# Patient Record
Sex: Female | Born: 1995 | Hispanic: Yes | Marital: Married | Smoking: Never smoker
Health system: Southern US, Community
[De-identification: ages and names within clinical notes are randomized; demographics above are authoritative.]

## PROBLEM LIST (undated history)

## (undated) DIAGNOSIS — Z789 Other specified health status: Secondary | ICD-10-CM

## (undated) HISTORY — PX: OVARIAN CYST SURGERY: SHX726

---

## 2015-09-24 NOTE — L&D Delivery Note (Signed)
Patient had been pushing for >3 hours with about a 1 hour break. On check patient was OT. Rotated patient to LOA and patient began to make progress with pushing.   Delivery Note At 10:54 AM a viable female was delivered via Vaginal, Spontaneous Delivery (Presentation: vertex;LOA  ).  APGAR: 9, 9; weight pending  .   Placenta status: intact delivered with gently traction  Cord: 3 vessle with the following complications: none .    Anesthesia:  epidrual Episiotomy: None Lacerations: 1st degree Suture Repair: 3.0 monocryl Est. Blood Loss (mL):  250  Mom to postpartum.  Baby to Couplet care / Skin to Skin.  Ernestina Pennaicholas Cayce Quezada 07/24/2016, 11:17 AM

## 2015-12-05 ENCOUNTER — Ambulatory Visit (INDEPENDENT_AMBULATORY_CARE_PROVIDER_SITE_OTHER): Payer: Self-pay | Admitting: *Deleted

## 2015-12-05 ENCOUNTER — Encounter: Payer: Self-pay | Admitting: Family Medicine

## 2015-12-05 DIAGNOSIS — N926 Irregular menstruation, unspecified: Secondary | ICD-10-CM

## 2015-12-05 DIAGNOSIS — Z3201 Encounter for pregnancy test, result positive: Secondary | ICD-10-CM

## 2015-12-05 LAB — POCT PREGNANCY, URINE: Preg Test, Ur: POSITIVE — AB

## 2015-12-05 NOTE — Progress Notes (Signed)
[employer] interpreter # [phone] used for encounter.  LMP 11/20/15. Pregnancy verification letter given.

## 2016-02-15 LAB — OB RESULTS CONSOLE HIV ANTIBODY (ROUTINE TESTING): HIV: NONREACTIVE

## 2016-02-15 LAB — OB RESULTS CONSOLE RPR: RPR: NONREACTIVE

## 2016-02-15 LAB — OB RESULTS CONSOLE ABO/RH: RH Type: POSITIVE

## 2016-02-15 LAB — OB RESULTS CONSOLE RUBELLA ANTIBODY, IGM: Rubella: IMMUNE

## 2016-02-15 LAB — OB RESULTS CONSOLE GC/CHLAMYDIA
CHLAMYDIA, DNA PROBE: NEGATIVE
Gonorrhea: NEGATIVE

## 2016-02-15 LAB — OB RESULTS CONSOLE ANTIBODY SCREEN: Antibody Screen: NEGATIVE

## 2016-02-15 LAB — OB RESULTS CONSOLE HEPATITIS B SURFACE ANTIGEN: HEP B S AG: NEGATIVE

## 2016-07-04 LAB — OB RESULTS CONSOLE GBS: STREP GROUP B AG: NEGATIVE

## 2016-07-23 ENCOUNTER — Encounter (HOSPITAL_COMMUNITY): Payer: Self-pay | Admitting: *Deleted

## 2016-07-23 ENCOUNTER — Inpatient Hospital Stay (HOSPITAL_COMMUNITY)
Admission: AD | Admit: 2016-07-23 | Discharge: 2016-07-26 | DRG: 775 | Disposition: A | Discharge status: Home / Self Care | Payer: Medicaid Other | Source: Ambulatory Visit | Attending: Family Medicine | Admitting: Family Medicine

## 2016-07-23 ENCOUNTER — Inpatient Hospital Stay (EMERGENCY_DEPARTMENT_HOSPITAL)
Admission: AD | Admit: 2016-07-23 | Discharge: 2016-07-23 | Disposition: A | Discharge status: Home / Self Care | Payer: Medicaid Other | Source: Ambulatory Visit | Attending: Family Medicine | Admitting: Family Medicine

## 2016-07-23 DIAGNOSIS — Z3A39 39 weeks gestation of pregnancy: Secondary | ICD-10-CM

## 2016-07-23 DIAGNOSIS — O471 False labor at or after 37 completed weeks of gestation: Secondary | ICD-10-CM

## 2016-07-23 DIAGNOSIS — O479 False labor, unspecified: Secondary | ICD-10-CM

## 2016-07-23 HISTORY — DX: Other specified health status: Z78.9

## 2016-07-23 LAB — POCT FERN TEST: POCT FERN TEST: NEGATIVE

## 2016-07-23 MED ORDER — OXYCODONE-ACETAMINOPHEN 5-325 MG PO TABS
1.0000 | ORAL_TABLET | Freq: Once | ORAL | Status: AC
  Administered 2016-07-23: 1 via ORAL
  Filled 2016-07-23: qty 1

## 2016-07-23 NOTE — MAU Note (Signed)
Pains in lower abd and low back, started at 0700, getting stronger.  When first got up this morning felt some wetness.  Later noted a small amt of bloody mucous.

## 2016-07-23 NOTE — MAU Provider Note (Signed)
Chief Complaint:  Labor Eval   Provider saw patient at 1314    HPI: Summer Diaz is a 20 y.o. G1P0 at 10239w4dwho presents to maternity admissions reporting contractions and possible leaking.. She reports good fetal movement, denies vaginal itching/burning, urinary symptoms, h/a, dizziness, n/v, diarrhea, constipation or fever/chills.  She denies headache, visual changes or RUQ abdominal pain.  Vaginal Discharge  The patient's primary symptoms include pelvic pain, vaginal bleeding and vaginal discharge. The patient's pertinent negatives include no genital itching. This is a new problem. The current episode started today. The problem occurs intermittently. The problem has been waxing and waning. The pain is moderate. The problem affects both sides. She is pregnant. Associated symptoms include abdominal pain. Pertinent negatives include no constipation, diarrhea, dysuria, fever, headaches, nausea or vomiting. The vaginal discharge was bloody. The vaginal bleeding is lighter than menses. She has not been passing clots. She has not been passing tissue. Nothing aggravates the symptoms. She has tried nothing for the symptoms.   RN Note: Pains in lower abd and low back, started at 0700, getting stronger.  When first got up this morning felt some wetness.  Later noted a small amt of bloody mucous.  Past Medical History: Past Medical History:  Diagnosis Date  . Medical history non-contributory     Past obstetric history: OB History  Gravida Para Term Preterm AB Living  1            SAB TAB Ectopic Multiple Live Births               # Outcome Date GA Lbr Len/2nd Weight Sex Delivery Anes PTL Lv  1 Current               Past Surgical History: Past Surgical History:  Procedure Laterality Date  . OVARIAN CYST SURGERY      Family History: History reviewed. No pertinent family history.  Social History: Social History  Substance Use Topics  . Smoking status: Never Smoker  . Smokeless  tobacco: Never Used  . Alcohol use No    Allergies: No Known Allergies  Meds:  Prescriptions Prior to Admission  Medication Sig Dispense Refill Last Dose  . Prenatal Vit-Fe Fumarate-FA (PRENATAL MULTIVITAMIN) TABS tablet Take 1 tablet by mouth daily at 12 noon.   07/22/2016 at Unknown time    I have reviewed patient's Past Medical Hx, Surgical Hx, Family Hx, Social Hx, medications and allergies.   ROS:  Review of Systems  Constitutional: Negative for fever.  Gastrointestinal: Positive for abdominal pain. Negative for constipation, diarrhea, nausea and vomiting.  Genitourinary: Positive for pelvic pain and vaginal discharge. Negative for dysuria.  Neurological: Negative for headaches.   Other systems negative  Physical Exam  Patient Vitals for the past 24 hrs:  BP Temp Temp src Pulse Resp Height Weight  07/23/16 1658 (!) 92/52 - - 92 - - -  07/23/16 1236 99/56 98.4 F (36.9 C) Oral 102 18 4' 10.5" (1.486 m) 114 lb 9.6 oz (52 kg)   Constitutional: Well-developed, well-nourished female in no acute distress.  Cardiovascular: normal rate and rhythm Respiratory: normal effort, clear to auscultation bilaterally GI: Abd soft, non-tender, gravid appropriate for gestational age.   No rebound or guarding. MS: Extremities nontender, no edema, normal ROM Neurologic: Alert and oriented x 4.  GU: Neg CVAT.  PELVIC EXAM:    No pooling, no ferning    Scant bloody show  Initial Cervix 1cm/90/-2/vertex  After one hour:  Dilation: 2 Effacement (%):  90 Station: -1 Presentation: Vertex Exam by:: Ronna PolioElizabeth D'Andrea, RN   FHT:  Baseline 135 , moderate variability, accelerations present, no decelerations Contractions: q 4 mins Irregular    Labs: Results for orders placed or performed during the hospital encounter of 07/23/16 (from the past 24 hour(s))  Fern Test     Status: Normal   Collection Time: 07/23/16  1:19 PM  Result Value Ref Range   POCT Fern Test Negative = intact amniotic  membranes       Imaging:  No results found.  MAU Course/MDM: I have ordered labs and reviewed results.  NST reviewed SLight cervical change but then after third recheck, not much change We did give her a Percocet for pain with good relief Discharged home with labor precautions  Assessment: SIUP at 6563w4d Prodromal contractions No significant change in cervix  Plan: Discharge home Labor precautions and fetal kick counts Follow up in Office for prenatal visits and recheck  Encouraged to return here or to other Urgent Care/ED if she develops worsening of symptoms, increase in pain, fever, or other concerning symptoms.   Pt stable at time of discharge.  Wynelle BourgeoisMarie Zaydee Aina CNM, MSN Certified Nurse-Midwife 07/23/2016 5:14 PM

## 2016-07-23 NOTE — MAU Note (Signed)
Pt reports pain that is constant, was evaluated earlier today for labor and discharged.

## 2016-07-23 NOTE — Discharge Instructions (Signed)
Parto vaginal (Vaginal Delivery) Durante el parto, el mdico la ayudar a dar a luz a su beb. En elparto vaginal, deber pujar para que el beb salga por la vagina. Sin embargo, antes de que pueda sacar al beb, es necesario que ocurran ciertas cosas. La abertura del tero (cuello del tero) tiene que ablandarse, hacerse ms delgado y abrirse (dilatar) hasta que llegue a 10 cm. Adems, el beb tiene que bajar desde el tero a la vagina. SIGNOS DE TRABAJO DE PARTO  El mdico tendr primero que asegurarse de que usted est en trabajo de parto. Algunos signos son:   Eliminar lo que se llama tapn mucoso antes del inicio del trabajo de parto. Este es una pequea cantidad de mucosidad teida con sangre.  Tener contracciones uterinas regulares y dolorosas.   El tiempo entre las contracciones debe acortarse  Las molestias y el dolor se harn ms intensos gradualmente.  El dolor de las contracciones empeora al caminar y no se alivia con el reposo.   El cuello del tero se hace mas delgado (se borra) y se dilata. ANTES DEL PARTO Una vez que se inicie el trabajo de parto y sea admitida en el hospital o sanatorio, el mdico podr hacer lo siguiente:   Realizar un examen fsico.  Controlar si hay complicaciones relacionadas con el trabajo de parto.  Verificar su presin arterial, temperatura y pulso y la frecuencia cardaca (signos vitales).   Determinar si se ha roto el saco amnitico y cundo ha ocurrido.  Realizar un examen vaginal (utilizando un guante estril y un lubricante) para determinar:  La posicin (presentacin) del beb. El beb se presenta con la cabeza primero (vertex) en el canal de parto (vagina), o estn los pies o las nalgas primero (de nalgas)?  El nivel (estacin) de la cabeza del beb dentro del canal de parto.  El borramiento y la dilatacin del cuello uterino  El monitor fetal electrnico generalmente se coloca sobre el abdomen al llegar. Se utiliza para  controlar las contracciones y la frecuencia cardaca del beb.  Cuando el monitor est en el abdomen (monitor fetal externo), slo toma la frecuencia y la duracin de las contracciones. No informa acerca de la intensidad de las contracciones.  Si el mdico necesita saber exactamente la intensidad de las contracciones o cul es la frecuencia cardaca del beb, colocar un monitor interno en la vagina y el tero. El mdico comentar los riesgos y los beneficios de usar un monitor interno y le pedir autorizacin antes de colocar el dispositivo.  El monitoreo fetal continuo ser necesario si le han aplicado una epidural, si le administran ciertos medicamentos (como oxitocina) y si tiene complicaciones del embarazo o del trabajo de parto.  Podrn colocarle una va intravenosa en una vena del brazo para suministrarle lquidos y medicamentos, si es necesario. TRES ETAPAS DEL TRABAJO DE PARTO Y EL PARTO El trabajo de parto y el parto normales se dividen en tres etapas. Primera etapa Esta etapa comienza cuando comienzan las contracciones regulares y el cuello comienza a borrarse y dilatarse. Finaliza cuando el cuello est completamente abierto (completamente dilatado). La primera etapa es la etapa ms larga del trabajo de parto y puede durar desde 3 horas a 15 horas.  Algunos mtodos estn disponibles para ayudar con el dolor del parto. Usted y su mdico decidirn qu opcin es la mejor para usted. Las opciones incluyen:   Medicamentos narcticos. Estos son medicamentos fuertes que usted puede recibir a travs de una va intravenosa o   como inyeccin en el msculo. Estos medicamentos alivian el dolor pero no hacen que desaparezca completamente.  Epidural. Se administra un medicamento a travs de un tubo delgado que se inserta en la espalda. El medicamento adormece la parte inferior del cuerpo y evita el dolor en esa zona.  Bloqueo paracervical Es una inyeccin de un anestsico en cada lado del cuello  uterino.  Usted podr pedir un parto natural, que implica que no se usen analgsicos ni epidural durante el parto y el trabajo de parto. En cambio, podr tener otro tipo de ayuda como ejercicios respiratorios para hacer frente al dolor. Segunda etapa La segunda etapa del trabajo de parto comienza cuando el cuello se ha dilatado completamente a 10 cm. Contina hasta que usted puja al beb hacia abajo, por el canal de parto, y el beb nace. Esta etapa puede durar slo algunos minutos o algunas horas.  La posicin del la cabeza del beb a medida que pasa por el canal de parto, es informada como un nmero, llamado estacin. Si la cabeza del beb no ha iniciado su descenso, la estacin se describe como que est en menos 3 (-3). Cuando la cabeza del beb est en la estacin cero, est en el medio del canal de parto y se encaja en la pelvis. La estacin en la que se encuentra el beb indica el progreso de la segunda etapa del trabajo de parto.  Cuando el beb nace, el mdico lo sostendr con la cabeza hacia abajo para evitar que el lquido amnitico, el moco y la sangre entren en los pulmones del beb. La boca y la nariz del beb podrn ser succionadas con un pequeo bulbo para retirar todo lquido adicional.  El mdico podr colocar al beb sobre su estmago. Es importante evitar que el beb tome fro. Para hacerlo, el mdico secar al beb, lo colocar directamente sobre su piel, (sin mantas entre usted y el beb) y lo cubrir con mantas secas y tibias.  Se corta el cordn umbilical. Tercera etapa Durante la tercera etapa del trabajo de parto, el mdico sacar la placenta (alumbramiento) y se asegurar de que el sangrado est controlado. La salida de la placenta generalmente demora 5 minutos pero puede tardar hasta 30 minutos. Luego de la salida de la placenta, le darn un medicamento por va intravenosa o inyectable para ayudar a contraer el tero y controlar el sangrado. Si planea amamantar al beb,  puede intentar en este momento Luego de la salida de la placenta, el tero debe contraerse y quedar muy firme. Si el tero no queda firme, el mdico lo masajear. Esto es importante debido a que la contraccin del tero ayuda a cortar el sangrado en el sitio en que la placenta estaba unida al tero. Si el tero no se contrae adecuadamente ni permanece firme, podr causar un sangrado abundante. Si hay mucho sangrado, podrn darle medicamentos para contraer el tero y detener el sangrado.    Esta informacin no tiene como fin reemplazar el consejo del mdico. Asegrese de hacerle al mdico cualquier pregunta que tenga.   Document Released: 08/22/2008 Document Revised: 09/30/2014 Elsevier Interactive Patient Education 2016 Elsevier Inc.  

## 2016-07-24 ENCOUNTER — Inpatient Hospital Stay (HOSPITAL_COMMUNITY): Payer: Medicaid Other | Admitting: Anesthesiology

## 2016-07-24 ENCOUNTER — Encounter (HOSPITAL_COMMUNITY): Payer: Self-pay

## 2016-07-24 DIAGNOSIS — Z3403 Encounter for supervision of normal first pregnancy, third trimester: Secondary | ICD-10-CM | POA: Diagnosis present

## 2016-07-24 DIAGNOSIS — Z3A39 39 weeks gestation of pregnancy: Secondary | ICD-10-CM

## 2016-07-24 LAB — ABO/RH: ABO/RH(D): O POS

## 2016-07-24 LAB — CBC
HEMATOCRIT: 36.4 % (ref 36.0–46.0)
HEMOGLOBIN: 12.7 g/dL (ref 12.0–15.0)
MCH: 29.5 pg (ref 26.0–34.0)
MCHC: 34.9 g/dL (ref 30.0–36.0)
MCV: 84.5 fL (ref 78.0–100.0)
Platelets: 294 10*3/uL (ref 150–400)
RBC: 4.31 MIL/uL (ref 3.87–5.11)
RDW: 13.9 % (ref 11.5–15.5)
WBC: 14.4 10*3/uL — ABNORMAL HIGH (ref 4.0–10.5)

## 2016-07-24 LAB — TYPE AND SCREEN
ABO/RH(D): O POS
Antibody Screen: NEGATIVE

## 2016-07-24 LAB — RPR: RPR Ser Ql: NONREACTIVE

## 2016-07-24 MED ORDER — DIPHENHYDRAMINE HCL 25 MG PO CAPS: 25.0000 mg | ORAL_CAPSULE | Freq: Four times a day (QID) | ORAL | Status: DC | PRN

## 2016-07-24 MED ORDER — FENTANYL CITRATE (PF) 100 MCG/2ML IJ SOLN: 100.0000 ug | INTRAMUSCULAR | Status: DC | PRN

## 2016-07-24 MED ORDER — ACETAMINOPHEN 325 MG PO TABS: 650.0000 mg | ORAL_TABLET | ORAL | Status: DC | PRN

## 2016-07-24 MED ORDER — ONDANSETRON HCL 4 MG PO TABS: 4.0000 mg | ORAL_TABLET | ORAL | Status: DC | PRN

## 2016-07-24 MED ORDER — LACTATED RINGERS IV SOLN
INTRAVENOUS | Status: DC
  Administered 2016-07-24: 02:00:00 via INTRAVENOUS

## 2016-07-24 MED ORDER — LACTATED RINGERS IV SOLN: 500.0000 mL | INTRAVENOUS | Status: DC | PRN

## 2016-07-24 MED ORDER — EPHEDRINE 5 MG/ML INJ
10.0000 mg | INTRAVENOUS | Status: DC | PRN
  Filled 2016-07-24: qty 4

## 2016-07-24 MED ORDER — BENZOCAINE-MENTHOL 20-0.5 % EX AERO
1.0000 "application " | INHALATION_SPRAY | CUTANEOUS | Status: DC | PRN
  Administered 2016-07-24: 1 via TOPICAL
  Filled 2016-07-24: qty 56

## 2016-07-24 MED ORDER — OXYTOCIN 40 UNITS IN LACTATED RINGERS INFUSION - SIMPLE MED
2.5000 [IU]/h | INTRAVENOUS | Status: DC
  Filled 2016-07-24: qty 1000

## 2016-07-24 MED ORDER — OXYTOCIN BOLUS FROM INFUSION
500.0000 mL | Freq: Once | INTRAVENOUS | Status: AC
  Administered 2016-07-24: 500 mL via INTRAVENOUS

## 2016-07-24 MED ORDER — ZOLPIDEM TARTRATE 5 MG PO TABS: 5.0000 mg | ORAL_TABLET | Freq: Every evening | ORAL | Status: DC | PRN

## 2016-07-24 MED ORDER — DIBUCAINE 1 % RE OINT: 1.0000 "application " | TOPICAL_OINTMENT | RECTAL | Status: DC | PRN

## 2016-07-24 MED ORDER — PHENYLEPHRINE 40 MCG/ML (10ML) SYRINGE FOR IV PUSH (FOR BLOOD PRESSURE SUPPORT)
80.0000 ug | PREFILLED_SYRINGE | INTRAVENOUS | Status: DC | PRN
  Filled 2016-07-24: qty 10
  Filled 2016-07-24: qty 5

## 2016-07-24 MED ORDER — SENNOSIDES-DOCUSATE SODIUM 8.6-50 MG PO TABS
2.0000 | ORAL_TABLET | ORAL | Status: DC
  Administered 2016-07-24 – 2016-07-25 (×2): 2 via ORAL
  Filled 2016-07-24 (×2): qty 2

## 2016-07-24 MED ORDER — LIDOCAINE HCL (PF) 1 % IJ SOLN
INTRAMUSCULAR | Status: DC | PRN
  Administered 2016-07-24: 4 mL via EPIDURAL
  Administered 2016-07-24: 3 mL via EPIDURAL

## 2016-07-24 MED ORDER — WITCH HAZEL-GLYCERIN EX PADS: 1.0000 "application " | MEDICATED_PAD | CUTANEOUS | Status: DC | PRN

## 2016-07-24 MED ORDER — PRENATAL MULTIVITAMIN CH
1.0000 | ORAL_TABLET | Freq: Every day | ORAL | Status: DC
  Administered 2016-07-25 – 2016-07-26 (×2): 1 via ORAL
  Filled 2016-07-24 (×2): qty 1

## 2016-07-24 MED ORDER — SIMETHICONE 80 MG PO CHEW: 80.0000 mg | CHEWABLE_TABLET | ORAL | Status: DC | PRN

## 2016-07-24 MED ORDER — COCONUT OIL OIL: 1.0000 "application " | TOPICAL_OIL | Status: DC | PRN

## 2016-07-24 MED ORDER — LACTATED RINGERS IV SOLN
500.0000 mL | Freq: Once | INTRAVENOUS | Status: AC
  Administered 2016-07-24: 02:00:00 via INTRAVENOUS

## 2016-07-24 MED ORDER — LIDOCAINE HCL (PF) 1 % IJ SOLN
30.0000 mL | INTRAMUSCULAR | Status: DC | PRN
  Filled 2016-07-24: qty 30

## 2016-07-24 MED ORDER — ONDANSETRON HCL 4 MG/2ML IJ SOLN: 4.0000 mg | Freq: Four times a day (QID) | INTRAMUSCULAR | Status: DC | PRN

## 2016-07-24 MED ORDER — OXYCODONE-ACETAMINOPHEN 5-325 MG PO TABS: 2.0000 | ORAL_TABLET | ORAL | Status: DC | PRN

## 2016-07-24 MED ORDER — TETANUS-DIPHTH-ACELL PERTUSSIS 5-2.5-18.5 LF-MCG/0.5 IM SUSP
0.5000 mL | Freq: Once | INTRAMUSCULAR | Status: AC
  Administered 2016-07-25: 0.5 mL via INTRAMUSCULAR
  Filled 2016-07-24: qty 0.5

## 2016-07-24 MED ORDER — ONDANSETRON HCL 4 MG/2ML IJ SOLN: 4.0000 mg | INTRAMUSCULAR | Status: DC | PRN

## 2016-07-24 MED ORDER — IBUPROFEN 600 MG PO TABS
600.0000 mg | ORAL_TABLET | Freq: Four times a day (QID) | ORAL | Status: DC
  Administered 2016-07-24 – 2016-07-26 (×9): 600 mg via ORAL
  Filled 2016-07-24 (×9): qty 1

## 2016-07-24 MED ORDER — SOD CITRATE-CITRIC ACID 500-334 MG/5ML PO SOLN: 30.0000 mL | ORAL | Status: DC | PRN

## 2016-07-24 MED ORDER — METHYLERGONOVINE MALEATE 0.2 MG/ML IJ SOLN
INTRAMUSCULAR | Status: AC
  Filled 2016-07-24: qty 1

## 2016-07-24 MED ORDER — DIPHENHYDRAMINE HCL 50 MG/ML IJ SOLN: 12.5000 mg | INTRAMUSCULAR | Status: DC | PRN

## 2016-07-24 MED ORDER — FENTANYL 2.5 MCG/ML BUPIVACAINE 1/10 % EPIDURAL INFUSION (WH - ANES)
14.0000 mL/h | INTRAMUSCULAR | Status: DC | PRN
  Administered 2016-07-24: 14 mL/h via EPIDURAL
  Administered 2016-07-24: 11 mL/h via EPIDURAL
  Filled 2016-07-24: qty 125

## 2016-07-24 MED ORDER — OXYCODONE-ACETAMINOPHEN 5-325 MG PO TABS
1.0000 | ORAL_TABLET | ORAL | Status: DC | PRN
  Administered 2016-07-24: 1 via ORAL
  Filled 2016-07-24: qty 1

## 2016-07-24 MED ORDER — PHENYLEPHRINE 40 MCG/ML (10ML) SYRINGE FOR IV PUSH (FOR BLOOD PRESSURE SUPPORT)
80.0000 ug | PREFILLED_SYRINGE | INTRAVENOUS | Status: AC | PRN
  Administered 2016-07-24 (×3): 80 ug via INTRAVENOUS
  Filled 2016-07-24: qty 10

## 2016-07-24 NOTE — Anesthesia Procedure Notes (Signed)
Epidural Patient location during procedure: OB Start time: 07/24/2016 2:32 AM  Staffing Anesthesiologist: Mal AmabileFOSTER, Tonnya Garbett Performed: anesthesiologist   Preanesthetic Checklist Completed: patient identified, site marked, surgical consent, pre-op evaluation, timeout performed, IV checked, risks and benefits discussed and monitors and equipment checked  Epidural Patient position: sitting Prep: site prepped and draped and DuraPrep Patient monitoring: continuous pulse ox and blood pressure Approach: midline Location: L3-L4 Injection technique: LOR air  Needle:  Needle type: Tuohy  Needle gauge: 17 G Needle length: 9 cm and 9 Needle insertion depth: 4 cm Catheter type: closed end flexible Catheter size: 19 Gauge Catheter at skin depth: 10 cm Test dose: negative and Other  Assessment Events: blood not aspirated, injection not painful, no injection resistance, negative IV test and no paresthesia  Additional Notes Patient identified. Risks and benefits discussed including failed block, incomplete  Pain control, post dural puncture headache, nerve damage, paralysis, blood pressure Changes, nausea, vomiting, reactions to medications-both toxic and allergic and post Partum back pain. All questions were answered. Patient expressed understanding and wished to proceed. Sterile technique was used throughout procedure. Epidural site was Dressed with sterile barrier dressing. No paresthesias, signs of intravascular injection Or signs of intrathecal spread were encountered.  Patient was more comfortable after the epidural was dosed.Spanich interpreter used throughout procedure. Please see RN's note for documentation of vital signs and FHR which are stable.

## 2016-07-24 NOTE — MAU Note (Addendum)
Error

## 2016-07-24 NOTE — H&P (Signed)
LABOR AND DELIVERY ADMISSION HISTORY AND PHYSICAL NOTE  Summer Diaz is a 20 y.o. female G1P0 with IUP at 2847w5d by ultrasound on 02/22/16 presenting for SOL.   She reports positive fetal movement. She denies leakage of fluid or vaginal bleeding. She has been having strong contractions all day, was found to be in early labor when presented earlier to MAU yesterday.   Past Medical History: Past Medical History:  Diagnosis Date  . Medical history non-contributory     Past Surgical History: Past Surgical History:  Procedure Laterality Date  . OVARIAN CYST SURGERY      Obstetrical History: OB History    Gravida Para Term Preterm AB Living   1             SAB TAB Ectopic Multiple Live Births                  Social History: Social History   Social History  . Marital status: Married    Spouse name: N/A  . Number of children: N/A  . Years of education: N/A   Social History Main Topics  . Smoking status: Never Smoker  . Smokeless tobacco: Never Used  . Alcohol use No  . Drug use: No  . Sexual activity: Not Asked   Other Topics Concern  . None   Social History Narrative  . None    Family History: History reviewed. No pertinent family history.  Allergies: No Known Allergies  Prescriptions Prior to Admission  Medication Sig Dispense Refill Last Dose  . Prenatal Vit-Fe Fumarate-FA (PRENATAL MULTIVITAMIN) TABS tablet Take 1 tablet by mouth daily at 12 noon.   07/22/2016 at Unknown time     Review of Systems   All systems reviewed and negative except as stated in HPI  Blood pressure 112/62, pulse 103. General appearance: alert, cooperative, appears stated age, mild distress and moderate distress Heart: regular rate and rhythm with intact distal pulsesl Extremities: No calf swelling or tenderness Fetal monitoring: reactive Uterine activity: Q 2-3 minutes Dilation: 3.5 Effacement (%): 100 Station: -1 Exam by:: B Mosca   Prenatal labs: ABO, Rh:    O Pos Antibody:   Negative Rubella: !Error! Immune RPR:   Neg HBsAg:   Neg HIV:   Nonreactive GBS:    Negative 1 hr Glucola: 105 Genetic screening:  None Anatomy US: Normal  Prenatal Transfer Tool  Maternal Diabetes: No Genetic Screening:None Maternal Ultrasounds/Referrals: Normal Fetal Ultrasounds or other Referrals:  None Maternal Substance Abuse:  No Significant Maternal Medications:  None Significant Maternal Lab Results: None  Results for orders placed or performed during the hospital encounter of 07/23/16 (from the past 24 hour(s))  The [employer]eps[employer]Fern Test   Collection Time: 07/23/16  1:19 PM  Result Value Ref Range   POCT Fern Test Negative = intact amniotic membranes     There are no active problems to display for this patient.   Assessment: Summer Diaz is a 20 y.o. G1P0 at 4347w5d here for SOL  #[facility] will continue to monitor for progression of labor. Contractions at are 2-3 minutes #Pain: IV Fentanyl with subsequent placement of an epidural #FWB: Reactive #ID:  GBS negative #MOF: Breast and Bottle #MOC:Depo #Circ:  N/A, female fetus  Josue D Santos 07/24/2016, 1:02 AM   OB FELLOW HISTORY AND PHYSICAL ATTESTATION  I have seen and examined this patient; I agree with above documentation in the resident's note.   Patient is in active labor, she  is uncomfortable during contractions. She is cephalic on physical exam, still intact.    Jen MowElizabeth Merleen Picazo, DO OB Fellow 07/24/2016, 6:41 PM

## 2016-07-24 NOTE — Anesthesia Postprocedure Evaluation (Signed)
Anesthesia Post Note  Patient: Summer Diaz  Procedure(s) Performed: * No procedures listed *  Patient location during evaluation: Mother Baby Anesthesia Type: Epidural Level of consciousness: awake and alert Pain management: satisfactory to patient Vital Signs Assessment: post-procedure vital signs reviewed and stable Respiratory status: respiratory function stable Cardiovascular status: stable Postop Assessment: no headache, no backache, epidural receding, patient able to bend at knees, no signs of nausea or vomiting and adequate PO intake Anesthetic complications: no     Last Vitals:  Vitals:   07/24/16 1216 07/24/16 1400  BP: 100/60 99/63  Pulse: (!) 125 (!) 119  Resp: 20 18  Temp: 37.6 C 36.7 C    Last Pain:  Vitals:   07/24/16 1545  TempSrc:   PainSc: 2    Pain Goal:                 Fumiko Cham

## 2016-07-24 NOTE — Lactation Note (Signed)
This note was copied from a baby's chart. Lactation Consultation Note  Patient Name: Summer Siri ColeYoselin Tzic-Lopez RUEAV'WToday's Date: 07/24/2016 Reason for consult: Initial assessment   Initial assessment with first time mom of < 1 hour old infant. Spoke with mom with assistance of Eda, hospital interpreter.    Infant STS with mom and crying. She was difficult to console and would not latch to breast or suckle on finger. She was noted to be warm to touch and RN confirmed infant with elevated temperature. Mom also pushed for > 3 hours.  Mom with small firm breasts and everted nipples. Colostrum easily expressible from both breasts. Mom was able to hand express. She was pleased to see colostrum. Infant was offered breast for > 20 minutes, she did not settle and did not suckle. Enc mom to keep her STS.   Enc mom to feed infant 8-12 x in 24 hours at first feeding cues. Enc mom to use pillows for support of infant with feeding. Enc mom to hand express prior to latch. Mom did well with helping infant to latch. Mom is planning to use breast and formula feeding. Discussed supply and demand and enc mom to offer breast before formula feeding. Mom voiced understanding.   BF Resources Handout and LC Brochure left at bedside. It was difficult to teach mom as infant was crying for the entire visit. Follow up with mom tomorrow and prn.      Maternal Data Formula Feeding for Exclusion: Yes Reason for exclusion: Mother's choice to formula and breast feed on admission Has patient been taught Hand Expression?: Yes Does the patient have breastfeeding experience prior to this delivery?: No  Feeding Feeding Type: Breast Fed Length of feed: 0 min  LATCH Score/Interventions Latch: Too sleepy or reluctant, no latch achieved, no sucking elicited. Intervention(s): Skin to skin;Teach feeding cues;Waking techniques  Audible Swallowing: None  Type of Nipple: Everted at rest and after stimulation  Comfort  (Breast/Nipple): Soft / non-tender     Hold (Positioning): Assistance needed to correctly position infant at breast and maintain latch. Intervention(s): Breastfeeding basics reviewed;Support Pillows;Skin to skin  LATCH Score: 5  Lactation Tools Discussed/Used WIC Program: Yes   Consult Status Consult Status: Follow-up Date: 07/25/16 Follow-up type: In-patient    Silas FloodSharon S Syretta Kochel 07/24/2016, 12:01 PM

## 2016-07-24 NOTE — Anesthesia Preprocedure Evaluation (Signed)
Anesthesia Evaluation  Patient identified by MRN, date of birth, ID band Patient awake    Reviewed: Allergy & Precautions, Patient's Chart, lab work & pertinent test results  Airway Mallampati: III       Dental no notable dental hx. (+) Teeth Intact   Pulmonary neg pulmonary ROS,    Pulmonary exam normal breath sounds clear to auscultation       Cardiovascular negative cardio ROS Normal cardiovascular exam Rhythm:Regular Rate:Normal     Neuro/Psych negative neurological ROS  negative psych ROS   GI/Hepatic negative GI ROS, Neg liver ROS,   Endo/Other  negative endocrine ROS  Renal/GU negative Renal ROS  negative genitourinary   Musculoskeletal negative musculoskeletal ROS (+)   Abdominal   Peds  Hematology negative hematology ROS (+)   Anesthesia Other Findings   Reproductive/Obstetrics (+) Pregnancy                             Anesthesia Physical Anesthesia Plan  ASA: II  Anesthesia Plan: Epidural   Post-op Pain Management:    Induction:   Airway Management Planned: Natural Airway  Additional Equipment:   Intra-op Plan:   Post-operative Plan:   Informed Consent: I have reviewed the patients History and Physical, chart, labs and discussed the procedure including the risks, benefits and alternatives for the proposed anesthesia with the patient or authorized representative who has indicated his/her understanding and acceptance.     Plan Discussed with: Anesthesiologist  Anesthesia Plan Comments:         Anesthesia Quick Evaluation

## 2016-07-25 MED ORDER — SENNOSIDES-DOCUSATE SODIUM 8.6-50 MG PO TABS: 2.0000 | ORAL_TABLET | ORAL | 1 refills | Status: AC

## 2016-07-25 NOTE — Progress Notes (Signed)
UR chart review completed.  

## 2016-07-25 NOTE — Lactation Note (Signed)
This note was copied from a baby's chart. Lactation Consultation Note  Interpreter present for Spanish. Mother concerned about her milk supply.  Discusssed supply and demand and cluster feeding. Reviewed hand expression and drops expressed to reassure mother. Assisted w/ latching in cradle hold.  Sucks and swallows observed. Worked on achieving depth. Reviewed engorgement care and monitoring voids/stools. Mom encouraged to feed baby 8-12 times/24 hours and with feeding cues.     Patient Name: Summer Diaz     Maternal Data    Feeding Feeding Type: Breast Fed  LATCH Score/Interventions Latch: Grasps breast easily, tongue down, lips flanged, rhythmical sucking.  Audible Swallowing: Spontaneous and intermittent  Type of Nipple: Everted at rest and after stimulation  Comfort (Breast/Nipple): Soft / non-tender     Hold (Positioning): Assistance needed to correctly position infant at breast and maintain latch.  LATCH Score: 9  Lactation Tools Discussed/Used     Consult Status      Dahlia ByesBerkelhammer, Moosa Bueche Beck[facility] Va Medical CenterBoschen Diaz, 11:20 AM

## 2016-07-25 NOTE — Discharge Instructions (Signed)
Instrucciones para la [place] sobre los cuidados domiciliarios (Home Care Instructions for Mom) Despus del alta, puede darse cuenta que an le quedan preguntas por hacer acerca de los cambios corporales, las actividades y los cuidados que siguen en las prximas semanas. La siguiente informacin la ayudar a responder algunas de esas preguntas. ACTIVIDAD  Reinicie sus actividades diarias en el hogar de modo gradual.  Permtase perodos de descanso a lo largo del da; duerma una siesta mientras el beb duerme, cuando le sea posible.  Evite levantar pesos (ms de 5 kg) y los deportes o trabajos extenuantes.  Si el nacimiento fue por cesrea, debe evitar pasar la aspiradora, subir muchas escaleras y conducir el auto durante 4-6 semanas.  Si le han practicado una operacin cesrea, solicite ayuda para las tareas del hogar hasta que se sienta bien como para Education officer, environmentalrealizar las actividades usted Pier[place]misma.  Pida consejo a su mdico si quiere Engineer, drillingpracticar actividades fsicas seguras despus del parto, especialmente si fue sometido a una operacin cesrea. FLUJO VAGINAL Y RETORNO DEL CICLO MENSTRUAL   El flujo vaginal puede continuar por 4-6 semanas despus del [place].  Generalmente, la cantidad disminuye y el color se hace ms claro.  Si usted est Du Pontdemasiado activa, puede reaparecer flujo de color rojo brillante y aumentar la cantidad.  Acustese, levante los pies, coloque compresas fras en la zona inferior del abdomen, haga reposo, y comunquese con su mdico si moja ms de 1 apsito por hora o elimina cogulos grandes.  El perodo menstrual generalmente retornar a las 6-8 semanas despus del parto.  Si usted amamanta al beb, se normalizar a Glass blower/designerpartir de las 8 [address] Circlesemanas luego de que deje de Museum/gallery exhibitions officeramamantar. CUIDADOS PERINEALES   Use la botella perineal ("peri-bottle") y [place]ie la toalla sanitaria cada vez que vaya al bao.  Use papel tis en lugar de papel higinico hasta que se cure la sutura.  Puede tomar  baos calientes en la baera (15-20 minutos) cuando lo desee.  Siga usando los apsitos indicados o un aerosol Microbiologistanalgsico.  Si el profesional lo aprueba, puede Customer service manageraplicar [place] en crema sobre la episiotoma para Engineer, materialsaliviar el dolor.  No utilice tampones ni se haga irrigaciones hasta que el sangrado vaginal se haya detenido (alrededor de 4 semanas)  Debe evitar las Clinical research associaterelaciones sexuales durante al menos 3-4 semanas despus del parto o hasta que el flujo rojo parduzco haya desaparecido completamente.  Higiencese de adelante hacia atrs. CUIDADOS DE LA INCISIN (CORTE EFECTUADO POR EL CIRUJANO)   Luego de la operacin cesrea, dchese siempre que desee, pero trate de mantener seca la zona de la incisin. INTESTINOS/HEMORROIDES   Beba al menos 6-8 vasos de lquidos descafeinados por da.  Incluya en su dieta granos enteros, frutas y vegetales crudos.  Si las hemorroides son un problema, los baos calientes frecuentes en la baera la San Buenaventuraaliviarn.  Trate de relajarse cuando deba evacuar el intestino.  Puede tomar laxantes de H. J. Heinzventa libre. Convrselo con el profesional que la asiste. NUTRICIN  Consuma una dieta bien balanceada que incluya los alimentos bsicos.  No trate de perder peso drsticamente eliminando muchas caloras.  Si est amamantando, beba al menos 8-10 vasos de lquidos descafeinados por da e incremente su ingesta en 600 caloras por da.  Contine tomando las vitaminas como en el perodo prenatal al Reynolds Americanmenos hasta el control post parto o hasta que su mdico le indique suspenderlas. LACTANCIA MATERNA  Si no  est amamantando:   Use un buen sostn de soporte.  Limite la ingesta de lquido durante  1  2 das [address], o segn las indicaciones del mdico, si las mamas se Barrister's clerk.  Evite la estimulacin del pezn y aplique compresas fras (no heladas) en las mamas para sentirse ms confortable.  Evite consumir alcohol y bebidas con cafena.  Podr Chemical engineer  un analgsico suave de venta libre si siente [employer] Station[employer].  No se recomiendan los medicamentos para disminuir la [place]. Si est amamantando:   Aliente al beb a mamar si piensa que tiene [place].  Lvese las manos antes de [place].  Higienice sus mamas con agua tibia antes de dar de mamar.  Comience a alimentar al beb entre 8 y [address] veces por [address] 10 a 15 minutos de cada mama para estimular la produccin de [place] y Radio producer al recin nacido.  Evite darle agua o el bibern al recin nacido, excepto que se le indique otra cosa.  Lleve al nio al pediatra a los 3 a 5 [address] de vida y [employer] a las 2  3 semanas para Development worker, community su progreso con Runner, broadcasting/film/video.  Comunquese con el pediatra si considera que el beb no gana el peso suficiente o puede estar perdiendo [place]. DEPRESIN POSPARTO  Luego del parto su organismo va a sufrir modificaciones drsticas en el nivel de hormonas. Puede sentir deseos de llorar sin motivo aparente y verse incapaz de enfrentarse a todos los cambios que trae un nuevo beb. Esta es una respuesta normal. Busque apoyo en su pareja o amigos. Tmese el tiempo necesario para adaptarse. Si estos sentimientos persisten luego de varias semanas, contctese con su mdico u otros profesionales que puedan ofrecerle ayuda.  Comunquese con el servicio de emergencias de su localidad, vaya a la sala de emergencias o solicite ayuda de inmediato de un familiar, un amigo, o un vecino si siente que puede llegar a lastimarse usted misma, al beb, o a cualquier Engineer, maintenance (IT).  EJERCICIOS  Comience con los ejercicios de Kegel enseguida despus del parto. Puede realizarlos mientras est parada, sentada o [place]. Tense los msculos del estmago y los que rodean el canal de [place]. Mantenga durante algunos segundos y luego reljese. Haga cinco repeticiones cada vez. Realice los ejercicios de Kegel como parte de su rutina diaria para Radio producer tono de los msculos que sostienen  la vagina, la vejiga y los intestinos.  AUTO EXAMEN DE MAMA   Haga auto exmenes en el mismo momento cada mes, el da de su eleccin. Craig Staggers informar a su mdico cualquier bulto, protuberancia o secrecin.  El mejor momento de Chief Operating Officer sus mamas, si est amamantado es despus de alimentar bien al beb, cuando los pechos no estn tan llenos. Si el perodo ha comenzado y est amamantando, contrlelos entre el 5 y el 7 da.  Recuerde, las mamas estn normalmente "abultadas" si usted est amamantando, debido a que las clulas que contienen leche estn llenas. Esto es transitorio y no es un riesgo para la salud INTIMIDAD Y SEXUALIDAD  Los que han sido padres recientemente necesitan adaptarse mutuamente en su intimidad y su sexualidad despus de haber tenido un beb. Trate de pasar tiempo con su pareja, comentando las maneras de ajustarse al [place], a las nuevas rutinas y al modo de [employer] deseos y necesidades de [place]. Puede ser necesario recibir [employer] Systems casos problemticos. n.  Si est amamantando o an no ha vuelto a tener el perodo menstrual, puede quedar embarazada. Use algn tipo de anticonceptivo para evitar un nuevo embarazo. Boyd Kerbs con  su mdico acerca de los mtodos anticonceptivos disponibles para su situacin SOLICITE ATENCIN MDICA DE INMEDIATO SI:  Aumenta la secrecin en la zona de cesrea, la episiotoma o herida, o la secrecin tiene mal olor.  Los apsitos se empapan con sangre en una hora o menos.  Siente dolor intenso o clicos en el abdomen inferior.  Brett Fairybserva una secrecin vaginal con olor ftido.  Aumento ms que disminucin del dolor en la sutura o hinchazn, enrojecimiento o rigidez en el rea.  Usted tiene una temperatura oral de ms de 38,9 C (102 F) y no puede controlarla con medicamentos.  Dolor y/o enrojecimiento en las pantorrillas.  Nuseas con vmitos durante 12 horas.  Repentino e intenso dolor en el pecho.  Falta de  aire.  Miccin dolorosa o con sangre.  Tiene problemas visuales.  Dolor de cabeza intenso.  Una zona de la mama se pone roja y dolorosa y usted Mauritaniatiene fiebre. Puede sentir como sntomas de gripe.   Esta informacin no tiene Theme park managercomo fin reemplazar el consejo del mdico. Asegrese de hacerle al mdico cualquier pregunta que tenga.   Document Released: 09/09/2005 Document Revised: 01/24/2015 Elsevier Interactive Patient Education 2016 Ar[employer]vier Inc. Parto vaginal, Cuidados posteriores  (Vaginal Delivery, Care After) Siga estas instrucciones durante las prximas semanas. Estas indicaciones para el alta le proporcionan informacin general acerca de cmo deber cuidarse despus del parto. El mdico tambin podr darle instrucciones especficas. El tratamiento ha sido planificado segn las prcticas mdicas actuales, pero en algunos casos pueden ocurrir problemas. Comunquese con el mdico si tiene algn problema o tiene preguntas al volver a su casa.  INSTRUCCIONES PARA EL CUIDADO EN EL HOGAR   Tome slo medicamentos de venta libre o recetados, segn las indicaciones del mdico o del Social research officer, governmentfarmacutico.  No beba alcohol, especialmente si est amamantando o toma analgsicos.  No mastique tabaco ni fume.  No consuma drogas.  Contine con un adecuado cuidado perineal. El buen cuidado perineal incluye:  Higienizarse de adelante hacia atrs.  Mantener la zona perineal limpia.  No use tampones ni duchas vaginales hasta que su mdico la autorice.  Dchese, lvese el cabello y tome baos de inmersin segn las indicaciones de su mdico.  Utilice un sostn que le ajuste bien y que brinde buen soporte a sus Building control surveyormamas.  Consuma alimentos saludables.  Beba suficiente lquido para Photographermantener la orina clara o de color amarillo plido.  Consuma alimentos ricos en fibra como cereales y panes integrales, arroz, frijoles y frutas y verduras frescas todos los Deshlerdas. Estos alimentos pueden ayudarla a prevenir o  Educational psychologistaliviar el estreimiento.  Siga las recomendaciones de su mdico relacionadas con la reanudacin de actividades como subir escaleras, conducir automviles, levantar objetos, hacer ejercicios o viajar.  Hable con su mdico acerca de reanudar la actividad sexual. Volver a la actividad sexual depende del riesgo de infeccin, la velocidad de la curacin y la comodidad y su deseo de Chartered loss adjusterreanudarla.  Trate de que alguien la ayude con las actividades del hogar y con el recin nacido al menos durante un par de das despus de salir del hospital.  Descanse todo lo que pueda. Trate de descansar o tomar una siesta mientras el beb est durmiendo.  Aumente sus actividades gradualmente.  Cumpla con todas las visitas de control programadas para despus del parto. Es muy importante asistir a todas las Merchant navy officercitas programadas de seguimiento. En estas citas, su mdico va a controlarla para asegurarse de que est sanando fsica y emocionalmente. SOLICITE ATENCIN MDICA SI:   Ileana LaddElimina  cogulos grandes por la vagina. Guarde algunos cogulos para mostrarle al mdico.  Tiene una secrecin con feo olor que proviene de la vagina.  Tiene dificultad para orinar.  Orina con frecuencia.  Siente dolor al [employer]oco[employer]ill[employer].  Nota un cambio en sus movimientos intestinales.  Aumenta el enrojecimiento, el dolor o la hinchazn en la zona de la incisin vaginal (episiotoma) o el desgarro vaginal.  Tiene pus que drena por la episiotoma o el desgarro vaginal.  La episiotoma o el desgarro vaginal se abren.  Sus [employer] duelen, estn duras o enrojecidas.  Sufre un dolor intenso de [place].  Tiene visin borrosa o ve manchas.  Se siente triste o deprimida.  Tiene pensamientos acerca de lastimarse o daar al recin nacido.  Tiene preguntas acerca de su cuidado personal, el cuidado del recin nacido o acerca de los medicamentos.  Se siente mareada o sufre un desmayo.  Tiene una erupcin.  Tiene nuseas o vmitos.  Usted  amamant al beb y no ha tenido su perodo menstrual dentro de las 12 semanas despus de dejar de Museum/gallery exhibitions officer.  No amamanta al beb y no tuvo su perodo menstrual en las ltimas 12 semanas despus del parto.  Tiene fiebre. SOLICITE ATENCIN MDICA DE INMEDIATO SI:   Siente dolor persistente.  Siente dolor en el pecho.  Le falta el aire.  Se desmaya.  Siente dolor en la pierna.  Siente Physiological scientist.  El sangrado vaginal satura dos o ms apsitos en 1 hora.   Esta informacin no tiene Theme park manager el consejo del mdico. Asegrese de hacerle al mdico cualquier pregunta que tenga.   Document Released: 09/09/2005 Document Revised: 05/31/2015 Elsevier Interactive Patient Education [employer]

## 2016-07-25 NOTE — Progress Notes (Signed)
Faculty practice doctor notified that infant is not ready for discharge and gave verbal order to discontinue discharge order for mom.

## 2016-07-25 NOTE — Discharge Summary (Signed)
OB Discharge Summary     Patient Name: Summer Diaz DOB: 05/17/1996 MRN: [id number]  Date of admission: 07/23/2016 Delivering MD: Ernestina PennaSCHENK, NICHOLAS MICHAEL   Date of discharge: 07/26/2016  Admitting diagnosis: 39.6w labor Intrauterine pregnancy: 9866w5d     Secondary diagnosis:  Active Problems:   Normal labor  Additional problems: None     Discharge diagnosis: Term Pregnancy Delivered                                                                                                Post partum procedures:None  Augmentation: None  Complications: None  Hospital course:  Onset of Labor With Vaginal Delivery     20 y.o. yo G1P1001 at 7666w5d was admitted in Latent Labor on 07/23/2016. Patient had an uncomplicated labor course as follows:  Membrane Rupture Time/Date: 4:15 AM ,07/24/2016   Intrapartum Procedures: Episiotomy: None [1]                                         Lacerations:  1st degree [2]  Patient had a delivery of a Viable infant. 07/24/2016  Information for the patient's newborn:  Adair Patterzic-Lopez, Girl Amritha [[id number]][[id number]]  Delivery Method: Vaginal, Spontaneous Delivery (Filed from Delivery Summary)    Pateint had an uncomplicated postpartum course.  She is ambulating, tolerating a regular diet, passing flatus, and urinating well. Patient is discharged home in stable condition on 07/26/16.    Physical exam  Vitals:   07/25/16 0606 07/25/16 0610 07/25/16 1746 07/26/16 0545  BP: (!) 95/55 (!) 90/52 (!) 96/53 (!) 96/52  Pulse: 92 89 97 86  Resp: 18  16 18   Temp: 97.4 F (36.3 C)  98 F (36.7 C) 98.2 F (36.8 C)  TempSrc: Oral  Oral Oral  SpO2: 100%      General: alert, cooperative and no distress Lochia: appropriate Uterine Fundus: firm Incision: N/A DVT Evaluation: No evidence of DVT seen on physical exam. Labs: Lab Results  Component Value Date   WBC 14.4 (H) 07/24/2016   HGB 12.7 07/24/2016   HCT 36.4 07/24/2016   MCV 84.5 07/24/2016   PLT 294  07/24/2016   No flowsheet data found.  Discharge instruction: per After Visit Summary and "Baby and Me Booklet".  After visit meds:    Medication List    TAKE these medications   prenatal multivitamin Tabs tablet Take 1 tablet by mouth daily at 12 noon.   senna-docusate 8.6-50 MG tablet Commonly known as:  Senokot-S Take 2 tablets by mouth daily.       Diet: routine diet  Activity: Advance as tolerated. Pelvic rest for 6 weeks.   Outpatient follow up:6 weeks Follow up Appt:No future appointments. Follow up Visit:No Follow-up on file.  Postpartum contraception: Depo Provera  Newborn Data: Live born female  Birth Weight: 8 lb 6.9 oz (3825 g) APGAR: 9, 9  Baby Feeding: Breast Disposition:home with mother   07/26/2016 Deniece ReeJosue D Santos, MD   CNM attestation I have seen and examined this  patient and agree with above documentation in the resident's note.   Summer Diaz is a 20 y.o. G1P1001 s/p SVD.   Pain is well controlled.  Plan for birth control is Depo-Provera.  Method of Feeding: breast  PE:  BP (!) 96/52 (BP Location: Left Arm)   Pulse 86   Temp 98.2 F (36.8 C) (Oral)   Resp 18   SpO2 100%   Breastfeeding? Unknown  Fundus firm   Recent Labs  07/24/16 0045  HGB 12.7  HCT 36.4     Plan: discharge today - postpartum care discussed - f/u clinic in 6 weeks for postpartum visit   Cam HaiSHAW, Chrishaun Sasso, CNM 11:38 AM  07/26/2016

## 2016-07-28 NOTE — Progress Notes (Signed)
POSTPARTUM PROGRESS NOTE  Post Partum Day 01 Subjective:  Summer Diaz is a 20 y.o. G1P1001 276w5d s/p SVD.  No acute events overnight.  Pt denies problems with ambulating, voiding or po intake.  She denies nausea or vomiting.  Pain is moderately controlled.  She has had flatus. She has not had bowel movement.  Lochia Minimal.   Objective: Blood pressure (!) 96/52, pulse 86, temperature 98.2 F (36.8 C), temperature source Oral, resp. rate 18, SpO2 100 %, unknown if currently breastfeeding.  Physical Exam:  General: alert, cooperative and no distress Lochia:normal flow Heart: RRR with intact distal pulses Abdomen: +BS, soft, nontender,  Uterine Fundus: firm DVT Evaluation: No calf swelling or tenderness  No results for input(s): HGB, HCT in the last 72 hours.  Assessment/Plan:  ASSESSMENT: Summer Diaz is a 20 y.o. G1P1001 526w5d s/p SVD  Patient is ready for discharge on PPD01, However discharge needed to be discontinued 2/2 Infant requiring an additional night's stay. Patient will be discharged tomorrow 07/26/2016   LOS: 2 days   Summer ReeJosue D Santos, MD 07/28/2016, 9:09 AM   I have seen and examined this patient and I agree with the above. Cam HaiSHAW, Jolyne Laye CNM 8:51 PM 07/31/2016

## 2019-09-24 NOTE — L&D Delivery Note (Addendum)
OB/GYN Faculty Practice Delivery Note  Summer Diaz is a 24 y.o. G2P1001 s/p SVD at [redacted]w[redacted]d. She was admitted for PROM.   ROM: 10h 52m with clear fluid GBS Status: Negative/-- (08/16 0000) Maximum Maternal Temperature: 99.84F  Labor Progress: Initial SVE: 3/70/-3. She then progressed to complete without any additional augmentation.   Delivery Date/Time: 05/30/2020, 12:29pm  Delivery: Called to room and patient was complete and pushing. Head delivered Direct OA. No nuchal cord present. Shoulder and body delivered in usual fashion. Infant with spontaneous cry, placed on mother's abdomen, dried and stimulated. Cord clamped x 2 after 1-minute delay, and cut by RN. Cord blood drawn. Placenta delivered spontaneously with gentle cord traction. Fundus firm with massage and Pitocin. Labia, perineum, vagina, and cervix inspected inspected with a 2nd degree laceration which was repaired with 3.0 vicryl with good hemostasis achieved.  Baby Weight: pending  Placenta: Sent to L&D Complications: None Lacerations: 2nd degree perineal  EBL: ~450 mL Analgesia: Epidural  Infant:  APGAR (1 MIN): 9   APGAR (5 MINS): 9      Leticia Penna, DO  Family Medicine PGY-3   05/30/2020, 2:07 PM    I attest that I was gowned and gloved for delivery of infant, placenta and repair, and I agree with the above documentation and findings.   Luna Kitchens

## 2019-11-24 ENCOUNTER — Ambulatory Visit (INDEPENDENT_AMBULATORY_CARE_PROVIDER_SITE_OTHER): Payer: Self-pay | Admitting: *Deleted

## 2019-11-24 ENCOUNTER — Other Ambulatory Visit: Payer: Self-pay

## 2019-11-24 ENCOUNTER — Encounter: Payer: Self-pay | Admitting: Family Medicine

## 2019-11-24 DIAGNOSIS — Z3200 Encounter for pregnancy test, result unknown: Secondary | ICD-10-CM

## 2019-11-24 DIAGNOSIS — Z3201 Encounter for pregnancy test, result positive: Secondary | ICD-10-CM

## 2019-11-24 LAB — POCT PREGNANCY, URINE: Preg Test, Ur: POSITIVE — AB

## 2019-11-24 NOTE — Progress Notes (Signed)
Here for pregnancy test which was positive. Reports LMP 09/15/19 and has regular periods. This makes her 10 weeks with EDD 06/21/2020. I informed her to start prenatal care .[facility]. She is already taking PNV.  Legrand Como

## 2019-11-25 NOTE — Progress Notes (Signed)
Patient seen and assessed by nursing staff during this encounter. I have reviewed the chart and agree with the documentation and plan.  Clova Morlock, MD 11/25/2019 2:24 PM   

## 2020-01-17 LAB — OB RESULTS CONSOLE HEPATITIS B SURFACE ANTIGEN: Hepatitis B Surface Ag: NEGATIVE

## 2020-01-17 LAB — OB RESULTS CONSOLE RPR: RPR: NONREACTIVE

## 2020-01-17 LAB — OB RESULTS CONSOLE HIV ANTIBODY (ROUTINE TESTING): HIV: NONREACTIVE

## 2020-01-17 LAB — OB RESULTS CONSOLE RUBELLA ANTIBODY, IGM: Rubella: IMMUNE

## 2020-01-18 ENCOUNTER — Other Ambulatory Visit (HOSPITAL_COMMUNITY): Payer: Self-pay | Admitting: Nurse Practitioner

## 2020-01-18 DIAGNOSIS — Z363 Encounter for antenatal screening for malformations: Secondary | ICD-10-CM

## 2020-02-01 ENCOUNTER — Encounter: Payer: Self-pay | Admitting: *Deleted

## 2020-02-03 ENCOUNTER — Other Ambulatory Visit: Payer: Self-pay

## 2020-02-03 ENCOUNTER — Ambulatory Visit (HOSPITAL_BASED_OUTPATIENT_CLINIC_OR_DEPARTMENT_OTHER): Payer: Medicaid Other | Admitting: Genetic Counselor

## 2020-02-03 ENCOUNTER — Other Ambulatory Visit: Payer: Self-pay | Admitting: *Deleted

## 2020-02-03 ENCOUNTER — Ambulatory Visit: Payer: Self-pay | Admitting: Genetic Counselor

## 2020-02-03 ENCOUNTER — Ambulatory Visit: Payer: Medicaid Other | Admitting: *Deleted

## 2020-02-03 ENCOUNTER — Ambulatory Visit (HOSPITAL_COMMUNITY): Payer: Medicaid Other | Attending: Nurse Practitioner

## 2020-02-03 VITALS — BP 98/55 | HR 82 | Temp 97.6°F

## 2020-02-03 DIAGNOSIS — Z3A20 20 weeks gestation of pregnancy: Secondary | ICD-10-CM

## 2020-02-03 DIAGNOSIS — Z3689 Encounter for other specified antenatal screening: Secondary | ICD-10-CM

## 2020-02-03 DIAGNOSIS — Z363 Encounter for antenatal screening for malformations: Secondary | ICD-10-CM | POA: Insufficient documentation

## 2020-02-03 DIAGNOSIS — Z315 Encounter for genetic counseling: Secondary | ICD-10-CM | POA: Diagnosis not present

## 2020-02-03 DIAGNOSIS — O358XX0 Maternal care for other (suspected) fetal abnormality and damage, not applicable or unspecified: Secondary | ICD-10-CM | POA: Diagnosis not present

## 2020-02-03 DIAGNOSIS — Z8489 Family history of other specified conditions: Secondary | ICD-10-CM

## 2020-02-03 DIAGNOSIS — Z362 Encounter for other antenatal screening follow-up: Secondary | ICD-10-CM

## 2020-02-03 IMAGING — US US MFM OB DETAIL+14 WK
1 series · 13 of 28 positions shown · non-contrast
Comparison: none

[Series 1: us mfm ob detail+14 wk · 158 acquisitions, 13 frames shown]
[im 6/158]
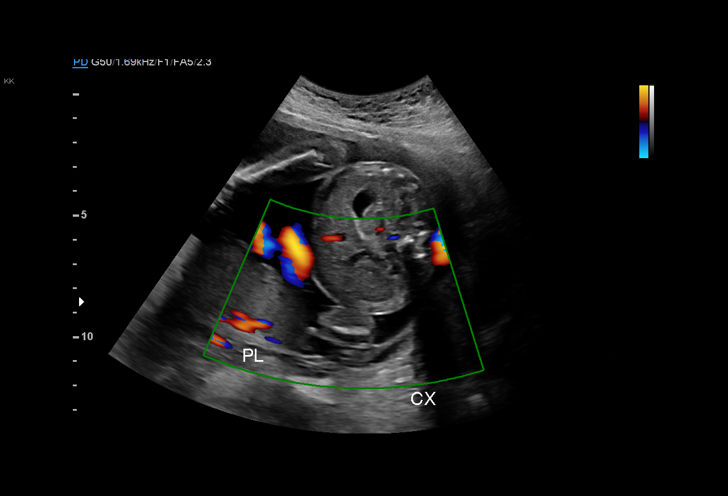
[im 18/158]
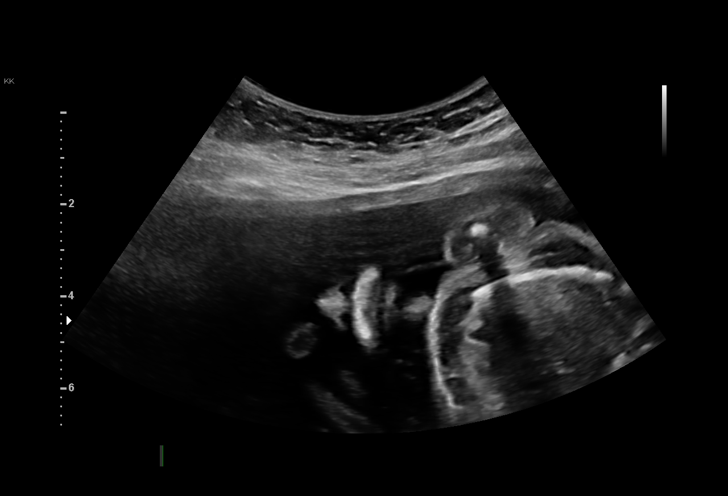
[im 30/158]
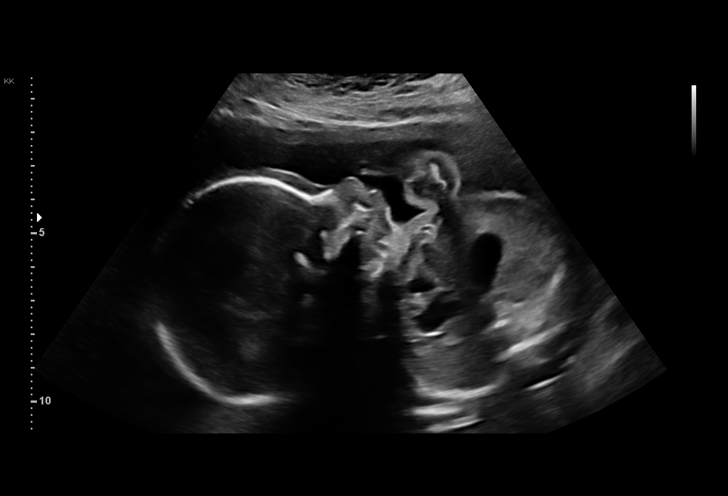
[im 41/158]
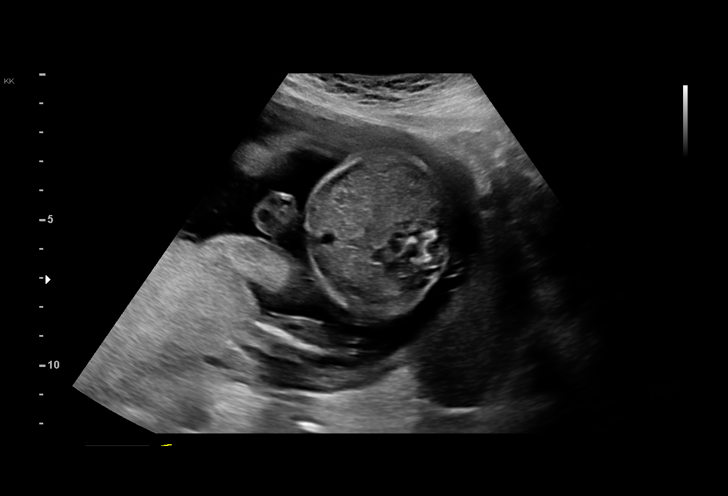
[im 53/158]
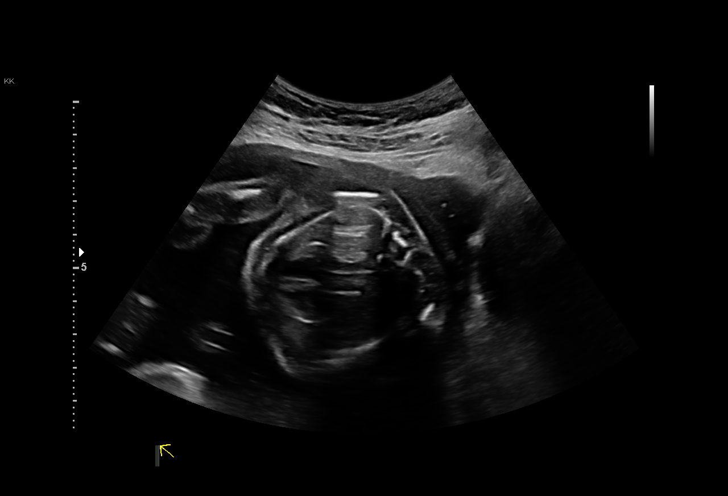
[im 64/158]
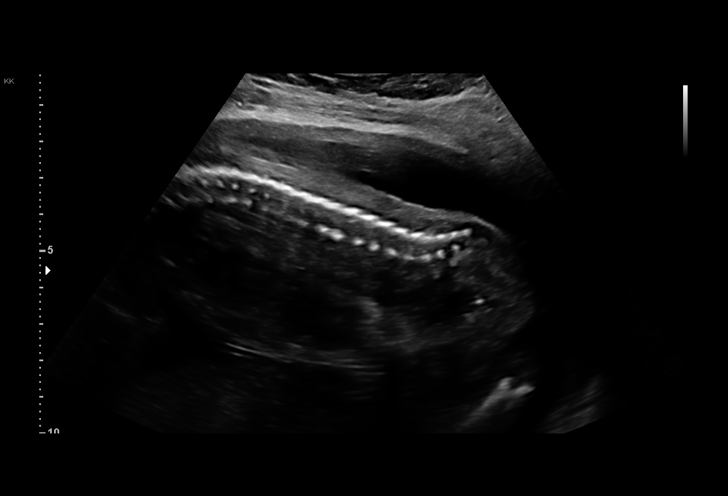
[im 82/158]
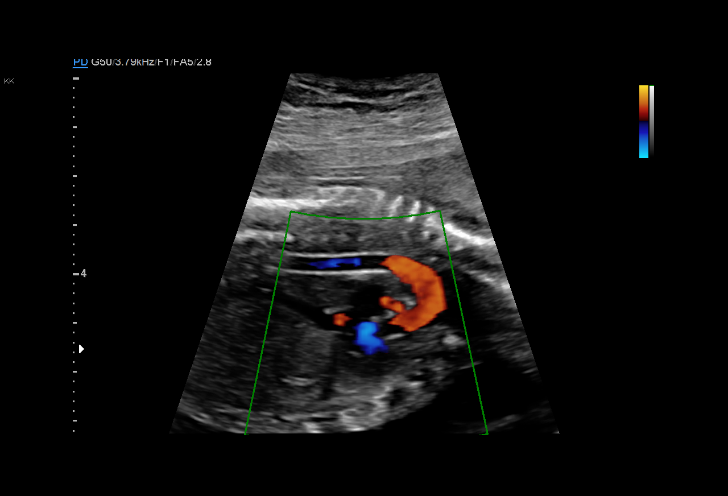
[im 94/158]
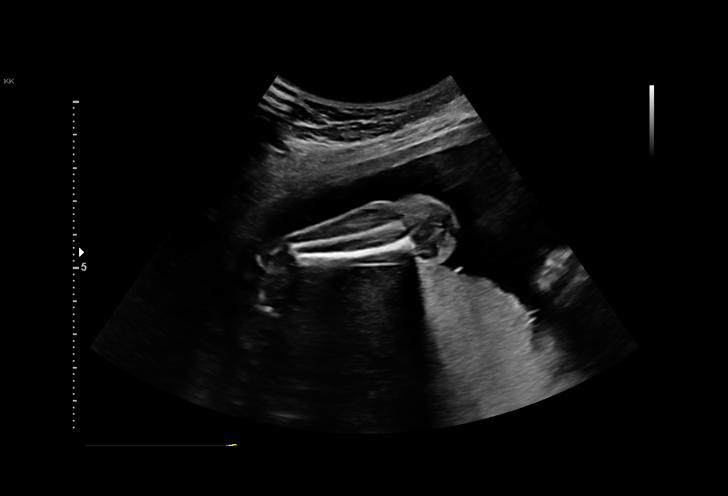
[im 105/158]
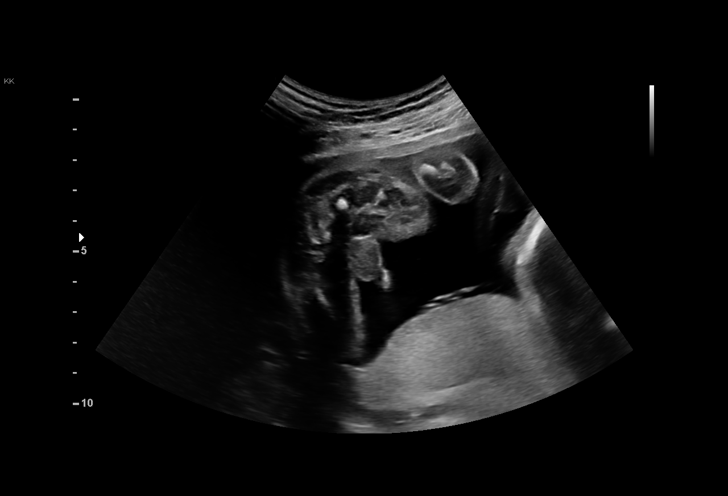
[im 117/158]
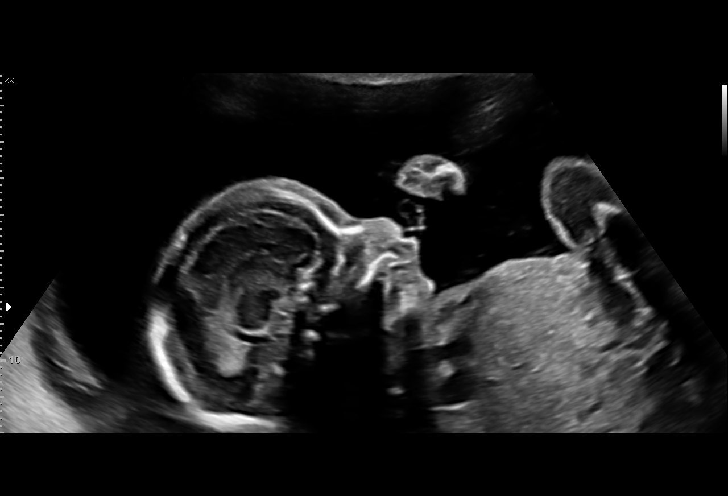
[im 128/158]
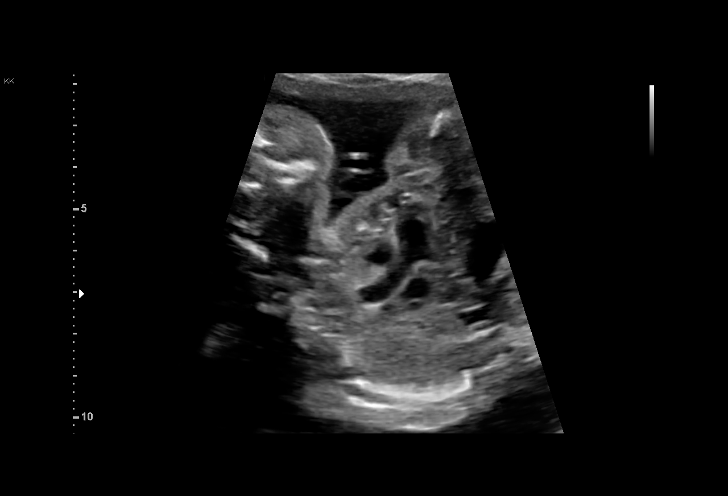
[im 140/158]
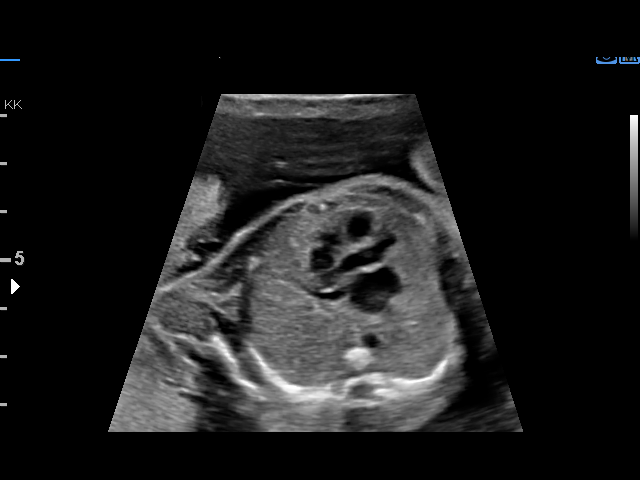
[im 152/158]
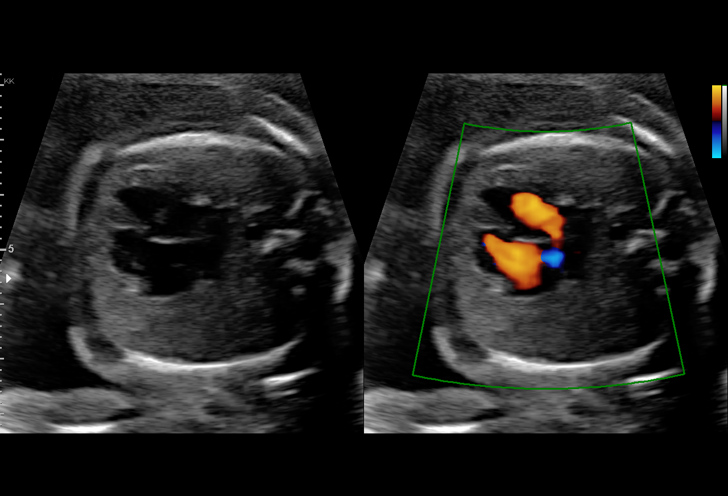

[13 of 28 positions shown; findings below may reference images not displayed]

Health
                   OURARI NP

                                                      OURARI

Indications

 Encounter for antenatal screening for          [AL]
 malformations
 20 weeks gestation of pregnancy
 Family history of genetic disorder (Facial     [AL]
 Malformations FOB)
Fetal Evaluation

 Num Of Fetuses:         1
 Fetal Heart Rate(bpm):  141
 Cardiac Activity:       Observed
 Presentation:           Variable
 Placenta:               Posterior
 P. Cord Insertion:      Visualized

 Amniotic Fluid
 AFI FV:      Within normal limits

                             Largest Pocket(cm)

Biometry

 BPD:      54.1  mm     G. Age:  22w 3d       > 99  %    CI:        70.92   %    70 - 86
                                                         FL/HC:      19.1   %    16.8 -
 HC:      204.7  mm     G. Age:  22w 4d       > 99  %    HC/AC:      1.03        1.09 -
 AC:      198.2  mm     G. Age:  24w 3d       > 99  %    FL/BPD:     72.3   %
 FL:       39.1  mm     G. Age:  22w 4d         98  %    FL/AC:      19.7   %    20 - 24
 HUM:      37.1  mm     G. Age:  23w 0d       > 95  %

 Est. FW:     599  gm      1 lb 5 oz   > 99  %
OB History

 Gravidity:    2         Term:   1        Prem:   0        SAB:   0
 TOP:          0       Ectopic:  0        Living: 1
Gestational Age

 LMP:           20w 1d        Date:  [DATE]                 EDD:   [DATE]
 U/S Today:     23w 0d                                        EDD:   [DATE]
 Best:          20w 1d     Det. By:  LMP  ([DATE])          EDD:   [DATE]
Anatomy

 Cranium:               Appears normal         Aortic Arch:            Appears normal
 Cavum:                 Appears normal         Ductal Arch:            Appears normal
 Ventricles:            Appears normal         Diaphragm:              Appears normal
 Choroid Plexus:        Appears normal         Stomach:                Appears normal, left
                                                                       sided
 Cerebellum:            Appears normal         Abdomen:                Appears normal
 Posterior Fossa:       Appears normal         Abdominal Wall:         Appears nml (cord
                                                                       insert, abd wall)
 Nuchal Fold:           Appears normal         Cord Vessels:           Appears normal (3
                                                                       vessel cord)
 Face:                  Appears normal         Kidneys:                Appear normal
                        (orbits and profile)
 Lips:                  Appears normal         Bladder:                Appears normal
 Thoracic:              Appears normal         Spine:                  Appears normal
 Heart:                 Appears normal         Upper Extremities:      Appears normal
                        (4CH, axis, and
                        situs)
 RVOT:                  Appears normal         Lower Extremities:      Appears normal
 LVOT:                  Appears normal

 Other:  Male gender. Heels visualized. Technically difficult due to fetal
         position.
Cervix Uterus Adnexa

 Cervix
 Length:            3.1  cm.
 Normal appearance by transabdominal scan.
Impression

 Ms. OURARI2 P1, is here for fetal anatomy scan and
 genetic counseling. Her husband was, apparently, born with
 some congenital facial malformation.
 Obstetric history is significant for a term vaginal delivery of a
 female infant (8 lbs) and her daughter is in good health.
 On serum screening (tetra), the risks of fetal aneuploidies are
 not increased.
 We performed fetal anatomy scan. Fetal biometry is
 consistent with 23 weeks' gestation that is 3 weeks ahead of
 her gestational age calculated by her LMP date. Amniotic
 fluid is normal and good fetal activity is seen. No markers of
 aneuploidies or fetal structural defects are seen. Fetal face
 including lips, nose and profile appear normal.
 I explained ultrasound findings with help of Spanish language
 interpreter present in the room. Patient is very sure of her
 LMP date.
 We have not amended her EDD. However, we recommended
 a follow-up fetal growth assessment in 4 weeks to check
 interval growth and consider amending her EDD.
 Patient met with our genetic counselor after ultrasound. You
 will be receiving a separate letter.
Recommendations

 -An appointment was made for her to return in 4 weeks for
 fetal growth assessment.
                 OURARI

## 2020-02-03 NOTE — Progress Notes (Signed)
02/03/2020  Summer Diaz 1996/08/29 MRN: [id number] DOV: 02/03/2020  Summer Diaz presented to the [facility] for a genetics consultation regarding a history of a congenital facial malformation in the father of the pregnancy. Summer Diaz came to her appointment alone due to COVID-19 visitor restrictions. This session was facilitated by a [employer] [place] interpreter.  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 female. Her current pregnancy has completed [redacted]w[redacted]d (Estimated Date of Delivery: 06/21/20). Summer Diaz and her partner have a healthy three year old daughter.    Summer Diaz denied exposure to environmental toxins or chemical agents. She denied the use of alcohol, tobacco or street drugs. She reported taking prenatal vitamins. She denied significant viral illnesses, fevers, and bleeding during the course of her pregnancy. Her medical and surgical histories were noncontributory.  Family History  A three generation pedigree was drafted and reviewed. The family history is remarkable for the following:  - Although records indicate that Summer Diaz had a history of seizures, she informed me that she has not ever had a seizure.  - The father of the pregnancy was born with a facial malformation affecting the left side of his face and his left ear. See Discussion section for more details.  The remaining family histories were reviewed and found to be noncontributory for birth defects, intellectual disability, recurrent pregnancy loss, and known genetic conditions.    The patient's ethnicity is [place]. The father of the pregnancy's ethnicity is [place]. Ashkenazi Jewish ancestry and consanguinity were denied. Pedigree will be scanned under Media.  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 Per Summer Diaz, her partner has a left-sided facial malformation that involves the shape and/or size of his face that also affects his left ear. This malformation does not have any difference in color suggestive of a vascular malformation. Her partner was reportedly born with this difference. It has never required surgical correction.  We discussed that congenital birth defects affect 3-5% of all babies born in the [place] each year. Some examples of congenital facial malformations include cleft lip and/or palate, craniosynostoses, hemifacial microsomia, vascular malformations, and hemangiomas. Based on what the patient told me, it does not seem like the father of the baby has a vascular malformation or a hemangioma. Craniosynostosis also seems unlikely given that his malformation is unilateral. It is possible that he may have hemifacial macrosomia. Hemifacial microsomia occurs when the tissues on one side of the face, including the ear, mouth, and jaw, are underdeveloped. However, without further details and/or a physical evaluation, it is difficult to determine this definitively.  We discussed that congenital facial malformations can be associated with genetic abnormalities and teratogenic or environmental exposures (such as vitamin A or valproic acid). Other times, congenital facial malformations may have an unknown cause. Recurrence risks depend on the underlying etiology of the malformation. In many cases, congenital anomalies occur randomly in an individual with no apparent family history. Rarely, congenital anomalies may be seen in multiple members of the same family. In these cases, autosomal dominant inheritance may be observed. Given that no one else in the father of the baby's family has a congenital facial malformation, risk of recurrence for the couple's children is likely low.  However, without information  about the etiology of his malformation, recurrence risk could be up to 50%.  Ultrasound:  A complete ultrasound was performed today prior to our visit. The ultrasound report will be sent under separate cover. There were no visualized fetal anomalies or markers suggestive of aneuploidy. The fetus's lips, nose, and profile appear normal. However, we discussed that ultrasound is unable to detect all possible congenital anomalies. Some may not be able to be diagnosed until birth.  Quad screening results:  Summer Diaz had quad screening performed which was reportedly negative. These results were not available for my review at the time of this consultation. We reviewed that while negative quad screening results reduce the likelihood of the pregnancy being affected by trisomy 21, trisomy 18, or open neural tube defects, it cannot be considered diagnostic.   Carrier screening:  Per ACOG recommendation, carrier screening for cystic fibrosis (CF), spinal muscular atrophy (SMA), and hemoglobinopathies was discussed including information about the conditions, rationale for testing, autosomal recessive inheritance, and the option of prenatal diagnosis. Summer Diaz previously had a negative hemoglobin electrophoresis and negative CF carrier screening, reducing her chances of being a carrier for a hemoglobinopathy such as sickle cell disease or a carrier of CF. I offered additional carrier screening for SMA, which Summer Diaz declined at this time. Without carrier screening to refine risk and based on ethnicity alone, Summer Diaz has a 1 in 117 chance of being a carrier for SMA. She was informed that select hemoglobinopathies, CF, and SMA are included on [place] 's newborn screen.  Diagnostic testing:  Summer Diaz was also counseled regarding diagnostic testing via amniocentesis. We discussed the technical aspects of the procedure and quoted up to a 1 in 500 (0.2%)  risk for spontaneous pregnancy loss or other adverse pregnancy outcomes as a result of amniocentesis. Cultured cells from an amniocentesis sample allow for the visualization of a fetal karyotype, which can detect >99% of chromosomal aberrations. Chromosomal microarray can also be performed to identify smaller deletions or duplications of fetal chromosomal material. After careful consideration, Ms. Fundora declined amniocentesis at this time. She understands that amniocentesis is available at any point after 16 weeks of pregnancy and that she may opt to undergo the procedure at a later date should she change her mind.  Plan:  Additional screening and diagnostic testing were declined today. She understands that screening tests, including ultrasound, cannot rule out all birth defects or genetic syndromes. The patient was advised of this limitation and states she still does not want additional testing or screening at this time.   I counseled Summer Diaz regarding the above risks and available options. The approximate face-to-face time with the genetic counselor was 25 minutes.  In summary:  Reviewed family history concerns  Father of pregnancy born with congenital facial malformation involving the left side of his face and left ear  Exact etiology of this malformation unknown  Recurrence risk likely low, but could be as high as 50% depending on underlying etiology  Discussed carrier screening for cystic fibrosis, spinal muscular atrophy, and hemoglobinopathies  Negative carrier screening for cystic fibrosis & hemoglobinopathies  Declined carrier screening for spinal muscular atrophy  Reviewed quad screening results  Reduction in risk for trisomy 21, trisomy 18, and open neural tube defects  Reviewed results of ultrasound  No fetal anomalies or markers seen  Reduction in risk for fetal aneuploidy  Offered additional testing and screening  Declined amniocentesis   Gershon Crane,  MS, [facility] Genetic Counselor

## 2020-03-02 ENCOUNTER — Ambulatory Visit: Payer: Medicaid Other | Attending: Obstetrics and Gynecology

## 2020-03-02 ENCOUNTER — Ambulatory Visit: Payer: Medicaid Other

## 2020-03-02 ENCOUNTER — Other Ambulatory Visit: Payer: Self-pay

## 2020-03-02 DIAGNOSIS — Z362 Encounter for other antenatal screening follow-up: Secondary | ICD-10-CM

## 2020-03-02 DIAGNOSIS — Z3687 Encounter for antenatal screening for uncertain dates: Secondary | ICD-10-CM | POA: Diagnosis not present

## 2020-03-02 DIAGNOSIS — Z3A27 27 weeks gestation of pregnancy: Secondary | ICD-10-CM | POA: Diagnosis not present

## 2020-03-03 ENCOUNTER — Other Ambulatory Visit: Payer: Self-pay | Admitting: *Deleted

## 2020-03-03 DIAGNOSIS — Z3493 Encounter for supervision of normal pregnancy, unspecified, third trimester: Secondary | ICD-10-CM

## 2020-04-20 ENCOUNTER — Other Ambulatory Visit: Payer: Self-pay

## 2020-04-20 ENCOUNTER — Ambulatory Visit: Payer: Medicaid Other | Attending: Obstetrics and Gynecology

## 2020-04-20 DIAGNOSIS — Z3493 Encounter for supervision of normal pregnancy, unspecified, third trimester: Secondary | ICD-10-CM | POA: Insufficient documentation

## 2020-04-20 DIAGNOSIS — Z362 Encounter for other antenatal screening follow-up: Secondary | ICD-10-CM

## 2020-04-20 DIAGNOSIS — Z3A34 34 weeks gestation of pregnancy: Secondary | ICD-10-CM

## 2020-04-20 DIAGNOSIS — Z3687 Encounter for antenatal screening for uncertain dates: Secondary | ICD-10-CM

## 2020-05-08 LAB — OB RESULTS CONSOLE GC/CHLAMYDIA
Chlamydia: NEGATIVE
Gonorrhea: NEGATIVE

## 2020-05-08 LAB — OB RESULTS CONSOLE GBS: GBS: NEGATIVE

## 2020-05-30 ENCOUNTER — Other Ambulatory Visit: Payer: Self-pay

## 2020-05-30 ENCOUNTER — Encounter (HOSPITAL_COMMUNITY): Payer: Self-pay | Admitting: Obstetrics and Gynecology

## 2020-05-30 ENCOUNTER — Inpatient Hospital Stay (HOSPITAL_COMMUNITY): Payer: Medicaid Other | Admitting: Anesthesiology

## 2020-05-30 ENCOUNTER — Inpatient Hospital Stay (HOSPITAL_COMMUNITY)
Admission: AD | Admit: 2020-05-30 | Discharge: 2020-06-01 | DRG: 806 | Disposition: A | Discharge status: Home / Self Care | Payer: Medicaid Other | Attending: Obstetrics and Gynecology | Admitting: Obstetrics and Gynecology

## 2020-05-30 DIAGNOSIS — O4292 Full-term premature rupture of membranes, unspecified as to length of time between rupture and onset of labor: Principal | ICD-10-CM | POA: Diagnosis present

## 2020-05-30 DIAGNOSIS — O26893 Other specified pregnancy related conditions, third trimester: Secondary | ICD-10-CM | POA: Diagnosis present

## 2020-05-30 DIAGNOSIS — Z30017 Encounter for initial prescription of implantable subdermal contraceptive: Secondary | ICD-10-CM

## 2020-05-30 DIAGNOSIS — O8612 Endometritis following delivery: Secondary | ICD-10-CM | POA: Diagnosis not present

## 2020-05-30 DIAGNOSIS — Z3A39 39 weeks gestation of pregnancy: Secondary | ICD-10-CM

## 2020-05-30 DIAGNOSIS — Z20822 Contact with and (suspected) exposure to covid-19: Secondary | ICD-10-CM | POA: Diagnosis present

## 2020-05-30 DIAGNOSIS — O479 False labor, unspecified: Secondary | ICD-10-CM

## 2020-05-30 DIAGNOSIS — R339 Retention of urine, unspecified: Secondary | ICD-10-CM | POA: Diagnosis not present

## 2020-05-30 DIAGNOSIS — O4290 Premature rupture of membranes, unspecified as to length of time between rupture and onset of labor, unspecified weeks of gestation: Secondary | ICD-10-CM | POA: Diagnosis present

## 2020-05-30 DIAGNOSIS — O99893 Other specified diseases and conditions complicating puerperium: Secondary | ICD-10-CM | POA: Diagnosis not present

## 2020-05-30 LAB — CBC
HCT: 35.6 % — ABNORMAL LOW (ref 36.0–46.0)
Hemoglobin: 12.1 g/dL (ref 12.0–15.0)
MCH: 30.6 pg (ref 26.0–34.0)
MCHC: 34 g/dL (ref 30.0–36.0)
MCV: 89.9 fL (ref 80.0–100.0)
Platelets: 303 10*3/uL (ref 150–400)
RBC: 3.96 MIL/uL (ref 3.87–5.11)
RDW: 13.3 % (ref 11.5–15.5)
WBC: 14.9 10*3/uL — ABNORMAL HIGH (ref 4.0–10.5)
nRBC: 0 % (ref 0.0–0.2)

## 2020-05-30 LAB — SARS CORONAVIRUS 2 BY RT PCR (HOSPITAL ORDER, PERFORMED IN ~~LOC~~ HOSPITAL LAB): SARS Coronavirus 2: NEGATIVE

## 2020-05-30 LAB — TYPE AND SCREEN
ABO/RH(D): O POS
Antibody Screen: NEGATIVE

## 2020-05-30 LAB — POCT FERN TEST: POCT Fern Test: NEGATIVE

## 2020-05-30 LAB — RPR: RPR Ser Ql: NONREACTIVE

## 2020-05-30 MED ORDER — ACETAMINOPHEN 325 MG PO TABS
650.0000 mg | ORAL_TABLET | ORAL | Status: DC | PRN
  Administered 2020-05-30: 650 mg via ORAL
  Filled 2020-05-30: qty 2

## 2020-05-30 MED ORDER — DIPHENHYDRAMINE HCL 25 MG PO CAPS: 25.0000 mg | ORAL_CAPSULE | Freq: Four times a day (QID) | ORAL | Status: DC | PRN

## 2020-05-30 MED ORDER — CLINDAMYCIN PHOSPHATE 900 MG/50ML IV SOLN
900.0000 mg | Freq: Three times a day (TID) | INTRAVENOUS | Status: DC
  Administered 2020-05-30 – 2020-05-31 (×3): 900 mg via INTRAVENOUS
  Filled 2020-05-30 (×9): qty 50

## 2020-05-30 MED ORDER — BENZOCAINE-MENTHOL 20-0.5 % EX AERO
1.0000 "application " | INHALATION_SPRAY | CUTANEOUS | Status: DC | PRN
  Administered 2020-05-31: 1 via TOPICAL
  Filled 2020-05-30: qty 56

## 2020-05-30 MED ORDER — PRENATAL MULTIVITAMIN CH
1.0000 | ORAL_TABLET | Freq: Every day | ORAL | Status: DC
  Administered 2020-05-31 – 2020-06-01 (×2): 1 via ORAL
  Filled 2020-05-30 (×2): qty 1

## 2020-05-30 MED ORDER — MEASLES, MUMPS & RUBELLA VAC IJ SOLR: 0.5000 mL | Freq: Once | INTRAMUSCULAR | Status: DC

## 2020-05-30 MED ORDER — SIMETHICONE 80 MG PO CHEW: 80.0000 mg | CHEWABLE_TABLET | ORAL | Status: DC | PRN

## 2020-05-30 MED ORDER — LIDOCAINE HCL (PF) 1 % IJ SOLN: 30.0000 mL | INTRAMUSCULAR | Status: DC | PRN

## 2020-05-30 MED ORDER — SODIUM CHLORIDE 0.9 % IV BOLUS
500.0000 mL | Freq: Once | INTRAVENOUS | Status: AC
  Administered 2020-05-30: 500 mL via INTRAVENOUS

## 2020-05-30 MED ORDER — ONDANSETRON HCL 4 MG/2ML IJ SOLN: 4.0000 mg | Freq: Four times a day (QID) | INTRAMUSCULAR | Status: DC | PRN

## 2020-05-30 MED ORDER — TETANUS-DIPHTH-ACELL PERTUSSIS 5-2.5-18.5 LF-MCG/0.5 IM SUSP: 0.5000 mL | Freq: Once | INTRAMUSCULAR | Status: DC

## 2020-05-30 MED ORDER — SODIUM CHLORIDE 0.9% FLUSH: 3.0000 mL | INTRAVENOUS | Status: DC | PRN

## 2020-05-30 MED ORDER — SODIUM CHLORIDE 0.9% FLUSH
3.0000 mL | Freq: Two times a day (BID) | INTRAVENOUS | Status: DC
  Administered 2020-05-31: 3 mL via INTRAVENOUS

## 2020-05-30 MED ORDER — LIDOCAINE HCL (PF) 1 % IJ SOLN
INTRAMUSCULAR | Status: DC | PRN
  Administered 2020-05-30 (×2): 4 mL via EPIDURAL

## 2020-05-30 MED ORDER — LACTATED RINGERS IV SOLN: INTRAVENOUS | Status: DC

## 2020-05-30 MED ORDER — ONDANSETRON HCL 4 MG PO TABS: 4.0000 mg | ORAL_TABLET | ORAL | Status: DC | PRN

## 2020-05-30 MED ORDER — OXYTOCIN BOLUS FROM INFUSION
333.0000 mL | Freq: Once | INTRAVENOUS | Status: AC
  Administered 2020-05-30: 333 mL via INTRAVENOUS

## 2020-05-30 MED ORDER — EPHEDRINE 5 MG/ML INJ: 10.0000 mg | INTRAVENOUS | Status: DC | PRN

## 2020-05-30 MED ORDER — SENNOSIDES-DOCUSATE SODIUM 8.6-50 MG PO TABS
2.0000 | ORAL_TABLET | ORAL | Status: DC
  Administered 2020-05-30 – 2020-05-31 (×2): 2 via ORAL
  Filled 2020-05-30 (×2): qty 2

## 2020-05-30 MED ORDER — OXYCODONE-ACETAMINOPHEN 5-325 MG PO TABS: 1.0000 | ORAL_TABLET | ORAL | Status: DC | PRN

## 2020-05-30 MED ORDER — FENTANYL-BUPIVACAINE-NACL 0.5-0.125-0.9 MG/250ML-% EP SOLN
12.0000 mL/h | EPIDURAL | Status: DC | PRN
  Filled 2020-05-30: qty 250

## 2020-05-30 MED ORDER — ONDANSETRON HCL 4 MG/2ML IJ SOLN: 4.0000 mg | INTRAMUSCULAR | Status: DC | PRN

## 2020-05-30 MED ORDER — FENTANYL CITRATE (PF) 2500 MCG/50ML IJ SOLN
INTRAMUSCULAR | Status: DC | PRN
Start: 2020-05-30 — End: 2020-05-30
  Administered 2020-05-30: 12 mL/h via EPIDURAL

## 2020-05-30 MED ORDER — ACETAMINOPHEN 325 MG PO TABS
650.0000 mg | ORAL_TABLET | ORAL | Status: DC | PRN
  Administered 2020-05-31: 650 mg via ORAL
  Filled 2020-05-30: qty 2

## 2020-05-30 MED ORDER — LACTATED RINGERS IV SOLN
500.0000 mL | Freq: Once | INTRAVENOUS | Status: AC
  Administered 2020-05-30: 500 mL via INTRAVENOUS

## 2020-05-30 MED ORDER — ETONOGESTREL 68 MG ~~LOC~~ IMPL
68.0000 mg | DRUG_IMPLANT | Freq: Once | SUBCUTANEOUS | Status: AC
  Administered 2020-05-30: 68 mg via SUBCUTANEOUS
  Filled 2020-05-30: qty 1

## 2020-05-30 MED ORDER — COCONUT OIL OIL
1.0000 "application " | TOPICAL_OIL | Status: DC | PRN
  Administered 2020-06-01: 1 via TOPICAL

## 2020-05-30 MED ORDER — OXYTOCIN-SODIUM CHLORIDE 30-0.9 UT/500ML-% IV SOLN
2.5000 [IU]/h | INTRAVENOUS | Status: DC
  Filled 2020-05-30: qty 500

## 2020-05-30 MED ORDER — LACTATED RINGERS IV SOLN: 500.0000 mL | INTRAVENOUS | Status: DC | PRN

## 2020-05-30 MED ORDER — IBUPROFEN 600 MG PO TABS
600.0000 mg | ORAL_TABLET | Freq: Four times a day (QID) | ORAL | Status: DC
  Administered 2020-05-30 – 2020-06-01 (×8): 600 mg via ORAL
  Filled 2020-05-30 (×8): qty 1

## 2020-05-30 MED ORDER — LIDOCAINE HCL 1 % IJ SOLN
INTRAMUSCULAR | Status: AC
  Administered 2020-05-30: 20 mL
  Filled 2020-05-30: qty 20

## 2020-05-30 MED ORDER — GENTAMICIN SULFATE 40 MG/ML IJ SOLN: 1.5000 mg/kg | Freq: Three times a day (TID) | INTRAVENOUS | Status: DC

## 2020-05-30 MED ORDER — SOD CITRATE-CITRIC ACID 500-334 MG/5ML PO SOLN: 30.0000 mL | ORAL | Status: DC | PRN

## 2020-05-30 MED ORDER — WITCH HAZEL-GLYCERIN EX PADS
1.0000 "application " | MEDICATED_PAD | CUTANEOUS | Status: DC | PRN
  Administered 2020-05-31: 1 via TOPICAL

## 2020-05-30 MED ORDER — SODIUM CHLORIDE 0.9 % IV SOLN: 250.0000 mL | INTRAVENOUS | Status: DC | PRN

## 2020-05-30 MED ORDER — LIDOCAINE HCL 1 % IJ SOLN: 0.0000 mL | Freq: Once | INTRAMUSCULAR | Status: DC | PRN

## 2020-05-30 MED ORDER — DIPHENHYDRAMINE HCL 50 MG/ML IJ SOLN: 12.5000 mg | INTRAMUSCULAR | Status: DC | PRN

## 2020-05-30 MED ORDER — GENTAMICIN SULFATE 40 MG/ML IJ SOLN
5.0000 mg/kg | INTRAVENOUS | Status: DC
  Administered 2020-05-30 – 2020-05-31 (×2): 290 mg via INTRAVENOUS
  Filled 2020-05-30 (×3): qty 7.25

## 2020-05-30 MED ORDER — PHENYLEPHRINE 40 MCG/ML (10ML) SYRINGE FOR IV PUSH (FOR BLOOD PRESSURE SUPPORT): 80.0000 ug | PREFILLED_SYRINGE | INTRAVENOUS | Status: DC | PRN

## 2020-05-30 MED ORDER — ACETAMINOPHEN 325 MG PO TABS: 650.0000 mg | ORAL_TABLET | ORAL | Status: DC | PRN

## 2020-05-30 MED ORDER — DIBUCAINE (PERIANAL) 1 % EX OINT: 1.0000 "application " | TOPICAL_OINTMENT | CUTANEOUS | Status: DC | PRN

## 2020-05-30 MED ORDER — PHENYLEPHRINE 40 MCG/ML (10ML) SYRINGE FOR IV PUSH (FOR BLOOD PRESSURE SUPPORT)
80.0000 ug | PREFILLED_SYRINGE | INTRAVENOUS | Status: DC | PRN
  Filled 2020-05-30: qty 10

## 2020-05-30 MED ORDER — OXYCODONE-ACETAMINOPHEN 5-325 MG PO TABS: 2.0000 | ORAL_TABLET | ORAL | Status: DC | PRN

## 2020-05-30 NOTE — Progress Notes (Signed)
Patient unable to void and in pain. In and out cathed patient. Used video interpreter "Sal" # [id number] to assist during procedure. Paused after emptying 900 mL out of bladder. Spoke with patient about antibiotics that were ordered. After a few minutes, continued to empty bladder another 550 mL of urine with a total of 1450 mL out. Encouraged patient to call staff for assistance to the bathroom again the next time. Patient verbalized understanding. Earl Gala, Linda Hedges [facility]

## 2020-05-30 NOTE — Discharge Summary (Signed)
Postpartum Discharge Summary    Patient Name: Summer Diaz DOB: 05-09-1996 MRN: [id number]  Date of admission: 05/30/2020 Delivery date:05/30/2020  Delivering provider: Starr Lake  Date of discharge: 06/01/2020  Admitting diagnosis: PROM (premature rupture of membranes) [O42.90] Intrauterine pregnancy: [redacted]w[redacted]d    Secondary diagnosis:  Active Problems:   Vaginal delivery   PROM (premature rupture of membranes)   Nexplanon insertion   Postpartum endometritis   Urinary retention  Additional problems: None     Discharge diagnosis: Term Pregnancy Delivered                                              Post partum procedures:None Augmentation: None Complications: None  Hospital course: Onset of Labor With Vaginal Delivery      24y.o. yo G2P1001 at 373w5das admitted in latent labor with PROM on 05/30/2020. Patient had an uncomplicated labor course as follows:  Membrane Rupture Time/Date: 2:00 AM ,05/30/2020   Delivery Method:Vaginal, Spontaneous  Episiotomy: None  Lacerations:  2nd degree  Postpartum course complicated by postpartum endometritis s/p clindamycin and gentamycin with good improvement. She also required replacement of foley catheter secondary to vulvar edema and urinary retention, but had successful TOV s/p foley removal. She is ambulating, tolerating a regular diet, passing flatus, and urinating well. Patient is discharged home in stable condition on 06/01/20.  Newborn Data: Birth date:05/30/2020  Birth time:12:29 PM  Gender:Female  Living status:Living  Apgars:9 ,9  Weight:4120 g   Magnesium Sulfate received: No BMZ received: No Rhophylac:No MMR:N/A T-DaP:Given prenatally Flu: No Transfusion:No  Physical exam  Vitals:   05/31/20 0330 05/31/20 1417 05/31/20 2114 06/01/20 0555  BP: 106/68 (!) 93/54 107/67 97/64  Pulse: 86 94 88 93  Resp: 16 16 18 18   Temp: 97.6 F (36.4 C) (!) 97.5 F (36.4 C) 97.6 F (36.4 C) (!) 97.3 F (36.3 C)   TempSrc: Axillary Oral Oral Oral  SpO2: 100% 100% 100% 99%  Weight:      Height:       General: alert, cooperative and no distress Lochia: appropriate Uterine Fundus: firm Incision: N/A DVT Evaluation: No evidence of DVT seen on physical exam. No cords or calf tenderness. No significant calf/ankle edema. Labs: Lab Results  Component Value Date   WBC 14.9 (H) 05/30/2020   HGB 12.1 05/30/2020   HCT 35.6 (L) 05/30/2020   MCV 89.9 05/30/2020   PLT 303 05/30/2020   No flowsheet data found. Edinburgh Score: No flowsheet data found.   After visit meds:  Allergies as of 06/01/2020   No Known Allergies     Medication List    TAKE these medications   acetaminophen 325 MG tablet Commonly known as: TYLENOL Take 325 mg by mouth every 6 (six) hours as needed for mild pain or headache.   coconut oil Oil Apply 1 application topically as needed (nipple pain).   ibuprofen 600 MG tablet Commonly known as: ADVIL Take 1 tablet (600 mg total) by mouth every 8 (eight) hours.   prenatal multivitamin Tabs tablet Take 1 tablet by mouth daily at 12 noon.   senna-docusate 8.6-50 MG tablet Commonly known as: Senokot-S Take 2 tablets by mouth daily.        Discharge home in stable condition Infant Feeding: Bottle and Breast Infant Disposition:home with mother Discharge instruction: per After Visit Summary and Postpartum booklet.  Activity: Advance as tolerated. Pelvic rest for 6 weeks.  Diet: routine diet Future Appointments:No future appointments. Follow up Visit:   Patient is HD patient; she PP Message was NOT sent.  Please schedule this patient for a In person postpartum visit in 4 weeks with the following provider: Any provider. Additional Postpartum F/U:None  Low risk pregnancy complicated by: None  Delivery mode:  Vaginal, Spontaneous  Anticipated Birth Control:  Nexplanon   Randa Ngo, MD OB Fellow, [facility] 06/01/2020 3:07 PM

## 2020-05-30 NOTE — H&P (Signed)
Summer Diaz is a 24 y.o. female presenting for Leaking fluid since 2am P[facility] been [facility] risk [facility].  FOB has facial deformity but USs have been normal  . OB History    Gravida  2   Para  1   Term  1   Preterm      AB      Living  1     SAB      TAB      Ectopic      Multiple  0   Live Births  1          Past Medical History:  Diagnosis Date  . Medical history non-contributory    Past Surgical History:  Procedure Laterality Date  . OVARIAN CYST SURGERY     Family History: family history is not on file. Social History:  reports that she has never smoked. She has never used smokeless tobacco. She reports that she does not drink alcohol and does not use drugs.     Maternal Diabetes: No Genetic Screening: Normal Maternal Ultrasounds/Referrals: Normal Fetal Ultrasounds or other Referrals:  None Maternal Substance Abuse:  No Significant Maternal Medications:  None Significant Maternal Lab Results:  Group B Strep negative Other Comments:  None  Review of Systems  Constitutional: Negative for chills and fever.  Respiratory: Negative for shortness of breath.   Gastrointestinal: Positive for abdominal pain. Negative for constipation, diarrhea and nausea.  Genitourinary: Positive for vaginal discharge (watery, copious, clear, +fern). Negative for vaginal bleeding.  Neurological: Negative for dizziness and weakness.   Maternal Medical History:  Reason for admission: Rupture of membranes and contractions.  Nausea.  Contractions: Onset was 3-5 hours ago.   Frequency: regular.   Perceived severity is moderate.   Since 2am   Fetal activity: Perceived fetal activity is normal.    Prenatal complications: No bleeding, PIH, placental abnormality, pre-eclampsia or preterm labor.   Prenatal Complications - Diabetes: none.    Dilation: 3.5 Effacement (%): 80 Station: -3 Exam by:: Wynelle Bourgeois,  CNM Blood pressure 115/69, pulse 94, temperature 98 F (36.7 C), temperature source Oral, resp. rate 18, last menstrual period 09/15/2019, SpO2 100 %, unknown if currently breastfeeding. Maternal Exam:  Uterine Assessment: Contraction strength is firm.  Contraction frequency is regular.   Abdomen: Patient reports no abdominal tenderness. Fetal presentation: vertex  Introitus: Normal vulva. Normal vagina.  Ferning test: positive.  Nitrazine test: not done. Amniotic fluid character: clear.  Pelvis: adequate for delivery.   Cervix: Cervix evaluated by digital exam.     Fetal Exam Fetal Monitor Review: Mode: ultrasound.   Baseline rate: 135.  Variability: moderate (6-25 bpm).   Pattern: accelerations present and no decelerations.    Fetal State Assessment: Category I - tracings are normal.     Physical Exam Constitutional:      General: She is not in acute distress.    Appearance: Normal appearance. She is normal weight. She is not ill-appearing.  HENT:     Head: Normocephalic.     Mouth/Throat:     Mouth: Mucous membranes are moist.  Cardiovascular:     Rate and Rhythm: Normal rate and regular rhythm.  Pulmonary:     Effort: Pulmonary effort is normal. No respiratory distress.  Abdominal:     General: There is no distension.     Tenderness: There is no abdominal tenderness. There is no guarding or rebound.  Genitourinary:    General: Normal vulva.  Comments: Dilation: 3.5 Effacement (%): 80 Station: -3 Presentation: Vertex Exam by:: Wynelle Bourgeois, CNM  Musculoskeletal:        General: Normal range of motion.     Cervical back: Normal range of motion.  Skin:    General: Skin is warm and dry.  Neurological:     General: No focal deficit present.     Mental Status: She is alert.  Psychiatric:        Mood and Affect: Mood normal.     Prenatal labs: ABO, Rh:   Antibody:   Rubella:   RPR:    HBsAg:    HIV:    GBS:     Assessment/Plan: Single IUP at  [redacted]w[redacted]d PROM at term Early active labor  Admit to Labor and Delivery Routine orders Epidural prn Anticipate SVD   Wynelle Bourgeois 05/30/2020, 4:56 AM

## 2020-05-30 NOTE — Anesthesia Procedure Notes (Addendum)
Epidural Patient location during procedure: OB Start time: 05/30/2020 6:19 AM End time: 05/30/2020 6:34 AM  Staffing Anesthesiologist: Lewie Loron, MD Performed: anesthesiologist   Preanesthetic Checklist Completed: patient identified, IV checked, risks and benefits discussed, monitors and equipment checked, pre-op evaluation and timeout performed  Epidural Patient position: sitting Prep: DuraPrep and site prepped and draped Patient monitoring: heart rate, continuous pulse ox and blood pressure Approach: midline Location: L2-L3 Injection technique: LOR air and LOR saline  Needle:  Needle type: Tuohy  Needle gauge: 17 G Needle length: 9 cm Needle insertion depth: 4 cm Catheter type: closed end flexible Catheter size: 19 Gauge Catheter at skin depth: 9 cm Test dose: negative  Assessment Sensory level: T8 Events: blood not aspirated, injection not painful, no injection resistance, no paresthesia and negative IV test  Additional Notes Reason for block:procedure for pain

## 2020-05-30 NOTE — Progress Notes (Signed)
Pt was unable to void earlier in my shift. Pt states she feels the urge to void but cannot void. After sitting on the toilet for 10 min she voided 45cc. I went back in to bladder scan her but she was sound asleep. I gave her a little bit to rest then went back in and bladder scanned her. She has 936cc of urine in her bladder. I attempted to in&out cath her twice. After two attempts I called a another nurse to attempt. She was unable to as well. Pt is very swollen. Pt is clearly in pain and feels discomfort. I told her we would give her a little bit to recover. I will call L&D to see if they can attempt.

## 2020-05-30 NOTE — Progress Notes (Signed)
Notified Dr. Annia Friendly of increased pulse and temperature at one hour check on mother baby unit. Patient has not eaten or had many po fluids since delivery since she has been trying to rest. Ordered patient's meal tray and encouraged po fluids. Dr. Annia Friendly ordered 500 mL bolus of normal saline now and continue to monitor. Earl Gala, Linda Hedges [place]

## 2020-05-30 NOTE — Anesthesia Preprocedure Evaluation (Signed)
Anesthesia Evaluation  Patient identified by MRN, date of birth, ID band Patient awake    Reviewed: Allergy & Precautions, Patient's Chart, lab work & pertinent test results  Airway Mallampati: III       Dental no notable dental hx. (+) Teeth Intact   Pulmonary neg pulmonary ROS,    Pulmonary exam normal breath sounds clear to auscultation       Cardiovascular negative cardio ROS Normal cardiovascular exam Rhythm:Regular Rate:Normal     Neuro/Psych negative neurological ROS  negative psych ROS   GI/Hepatic negative GI ROS, Neg liver ROS,   Endo/Other  negative endocrine ROS  Renal/GU negative Renal ROS  negative genitourinary   Musculoskeletal negative musculoskeletal ROS (+)   Abdominal   Peds  Hematology negative hematology ROS (+)   Anesthesia Other Findings   Reproductive/Obstetrics (+) Pregnancy                             Anesthesia Physical  Anesthesia Plan  ASA: II  Anesthesia Plan: Epidural   Post-op Pain Management:    Induction:   PONV Risk Score and Plan:   Airway Management Planned: Natural Airway  Additional Equipment:   Intra-op Plan:   Post-operative Plan:   Informed Consent: I have reviewed the patients History and Physical, chart, labs and discussed the procedure including the risks, benefits and alternatives for the proposed anesthesia with the patient or authorized representative who has indicated his/her understanding and acceptance.       Plan Discussed with:   Anesthesia Plan Comments:         Anesthesia Quick Evaluation

## 2020-05-30 NOTE — Progress Notes (Signed)
Notified Dr. Melba Coon of patient's increased temperature and heart rate upon recheck after normal saline bolus. Dr. Melba Coon to discuss with Dr. Annia Friendly; will await orders. Earl Gala, Linda [facility] [place]

## 2020-05-30 NOTE — MAU Note (Signed)
Video Interpreter # Summer Diaz [phone] Pt reports that her water broke at 0200 and then the contractions started.  Pt reports that they are coming every 5 minutes apart. +FM

## 2020-05-30 NOTE — Procedures (Signed)
Patient ID: Summer Diaz, female   DOB: 08/27/1996, 24 y.o.   MRN: [id number]   Called to evaluate pt for painful lump in vulva, not voiding on own, s/p in & out cath x2. Bladder scan showed 900+ml, only got 300+ml on cath. Pt having pelvic pain/pressure.  FF U+2, deviated to Rt Edematous vulva, Lt labia minora very edematous Foley cath inserted by me, immediately drained urine, pt's pain improving. Also wants in patient Nexplanon, will go ahead and place now  INPATIENT NEXPLANON INSERTION Patient name: [name] MRN [id number]  Date of birth: Nov 27, 1995  Risks/benefits/side effects of Nexplanon have been discussed and her questions have been answered.  Specifically, a failure rate of 09/998 has been reported, with an increased failure rate if pt takes St. John's Wort and/or antiseizure medicaitons.  She is aware of the common side effect of irregular bleeding, which the incidence of decreases over time. Signed copy of informed consent in chart.   Time out was performed.  She is right-handed, so her left arm, approximately 8-10 cm (3-4 inches) from the medial epicondyle of the humerus and 3-5 cm (1.25-2 inches) posterior to (below) the sulcus (groove) between the biceps and triceps muscle was cleansed with alcohol and anesthetized with 2cc of 2% Lidocaine.  The area was cleansed again with betadine and the Nexplanon was inserted per manufacturer's recommendations without difficulty.  3 steri-strips and pressure bandage were applied. The patient tolerated the procedure well. There was minimal blood loss.   Patient was given post procedure instructions and Nexplanon user card with expiration date. Patient was asked to keep the pressure dressing on for 24 hours to minimize bruising and keep the adhesive bandage on for 3-5 days. The patient verbalized understanding of the plan of care and agrees.   Pt added to the post-placental Nexplanon list.   Cheral Marker CNM,  [facility] 05/30/2020 10:20 PM

## 2020-05-30 NOTE — Plan of Care (Signed)
  Problem: Education: Goal: Knowledge of condition will improve Note: Admission education, safety and unit protocols reviewed with patient and significant other. Mother states she plans to breast and bottle feed and would like a bottle feed baby now. She breast fed some in L&D and is falling asleep; baby is showing feeding cues. Provided and explained to father (since mother was falling asleep) supplementation feeding amount guide along with formula preparation sheet. Video interpreter, Michelle Nasuti [phone], used for education. Earl Gala, Linda [facility] [place]

## 2020-05-31 NOTE — Progress Notes (Signed)
Post Partum Day 1 Subjective: Eating, drinking, voiding, ambulating well.  +flatus.  Lochia wnl.  Denies dizziness, lightheadedness, or sob.  Vulvar pain from swelling.   Objective: Blood pressure 106/68, pulse 86, temperature 97.6 F (36.4 C), temperature source Axillary, resp. rate 16, height 4' 11.84" (1.52 m), weight 57.9 kg, last menstrual period 09/15/2019, SpO2 100 %, unknown if currently breastfeeding.  Physical Exam:  General: alert, cooperative and no distress Lochia: appropriate Uterine Fundus: firm Incision: n/a DVT Evaluation: No evidence of DVT seen on physical exam. Negative Homan's sign. No cords or calf tenderness. No significant calf/ankle edema. Vulva still very edematous, has ice pack, TUCKS pads, dermoplast  Foley still in place and draining well, has had output since it was inserted, will plan to leave in until swelling subsides some  Afebrile overnight, has received 1 dose gent, 2 doses clinda  Lt arm doing well from Nexplanon insertion last night  Recent Labs    05/30/20 0544  HGB 12.1  HCT 35.6*    Assessment/Plan: Plan for discharge tomorrow, Breastfeeding and Contraception s/p Nexplanon insertion   LOS: 1 day   Cheral Marker 05/31/2020, 7:35 AM

## 2020-05-31 NOTE — Anesthesia Postprocedure Evaluation (Signed)
Anesthesia Post Note  Patient: Moniqua Tzic-Lopez  Procedure(s) Performed: AN AD HOC LABOR EPIDURAL     Patient location during evaluation: Mother Baby Anesthesia Type: Epidural Level of consciousness: awake Pain management: satisfactory to patient Vital Signs Assessment: post-procedure vital signs reviewed and stable Respiratory status: spontaneous breathing Cardiovascular status: stable Anesthetic complications: no   No complications documented.  Last Vitals:  Vitals:   05/30/20 2330 05/31/20 0330  BP: 97/61 106/68  Pulse: 90 86  Resp: 16 16  Temp: 36.5 C 36.4 C  SpO2: 100% 100%    Last Pain:  Vitals:   05/31/20 0545  TempSrc:   PainSc: 0-No pain   Pain Goal: Patients Stated Pain Goal: 2 (05/30/20 1540)   Report vis Oren Binet RN.  Pt ambulating to BR.  Has foley secondary to swelling post delivery.                   Cephus Shelling

## 2020-05-31 NOTE — Progress Notes (Signed)
Patient on IV antibiotics. Notified MD of leaking IV. Patient has had good temperatures all day. MD okay with removing IV at this time. Will restart IV and antibiotics if patient has a fever.

## 2020-06-01 DIAGNOSIS — R339 Retention of urine, unspecified: Secondary | ICD-10-CM

## 2020-06-01 DIAGNOSIS — O8612 Endometritis following delivery: Secondary | ICD-10-CM

## 2020-06-01 DIAGNOSIS — Z30017 Encounter for initial prescription of implantable subdermal contraceptive: Secondary | ICD-10-CM

## 2020-06-01 MED ORDER — COCONUT OIL OIL: 1.0000 "application " | TOPICAL_OIL | 0 refills | Status: AC | PRN

## 2020-06-01 MED ORDER — IBUPROFEN 600 MG PO TABS: 600.0000 mg | ORAL_TABLET | Freq: Three times a day (TID) | ORAL | 0 refills | Status: AC

## 2023-09-27 DIAGNOSIS — M25511 Pain in right shoulder: Secondary | ICD-10-CM | POA: Diagnosis present

## 2023-09-27 DIAGNOSIS — Z5321 Procedure and treatment not carried out due to patient leaving prior to being seen by health care provider: Secondary | ICD-10-CM | POA: Insufficient documentation

## 2023-09-28 ENCOUNTER — Emergency Department (HOSPITAL_COMMUNITY)
Admission: EM | Admit: 2023-09-28 | Discharge: 2023-09-28 | Discharge status: Against Medical Advice | Payer: Medicaid Other | Attending: Emergency Medicine | Admitting: Emergency Medicine

## 2023-09-28 ENCOUNTER — Encounter (HOSPITAL_COMMUNITY): Payer: Self-pay

## 2023-09-28 ENCOUNTER — Other Ambulatory Visit: Payer: Self-pay

## 2023-09-28 ENCOUNTER — Emergency Department (HOSPITAL_COMMUNITY): Payer: Medicaid Other

## 2023-09-28 NOTE — ED Triage Notes (Signed)
 Pt reports R shoulder pain radiating down her arm x2 days. Pt also reports prickly feeling in hand.

## 2023-09-28 NOTE — ED Notes (Signed)
 Pt advised sort staff she is leaving. OTF

## 2023-09-28 NOTE — ED Notes (Signed)
 Pt advised sort staff they are leaving

## 2023-10-24 NOTE — Progress Notes (Signed)
 Subjective   HPI:  Summer Diaz is a 28 y.o.  female who presents to the office with:  Chief Complaint  Patient presents with  . Establish Care    Patient agrees to use interpreter at this time. Patient presents today with spouse. Concerns of upper back pain, shoulders and hips. No known injury. Onset back pain, one month. Complaints of rib and ovary pain, onset 3 weeks ago.  . Gaps In Care    [facility]// [facility]   Interpreter: Kyra [phone]  Patient presents today to establish care. She reports concerns of abdominal pain, back pain, shoulder pain, and hip pain. Symptoms started within the past month. Pain is constant and she says it feels like she exercised a lot and is sore all over.  She also experiences intermittent sharp stabbing pains.  She cannot recall any specific injury.  She admits she is very sedentary and therefore cannot recall any physical activity that would be the cause of her symptoms.  She has been taking Tylenol  and ibuprofen  without improvement.  Within the past few days she has also had some diarrhea but this is new or compared to the rest of her symptoms.  LMP 4 months ago.  She is sexually active with her husband but she does have the Nexplanon  arm implant.  Denies fever, chills, nausea, vomiting, chest pain, shortness of breath, urinary symptoms, joint swelling, leg swelling    PMH: Past medical history, Past surgical history, Social history, family history were reviewed as noted in EMR.  Pertinent for:  History reviewed. No pertinent past medical history.   Medications and allergies reviewed.  ROS: Per HPI.  Objective   Vital Signs: BP 119/79 (BP Location: Right Upper Arm, Patient Position: Sitting)   Pulse 89   Temp 97.5 F (36.4 C) (Temporal)   Resp 12   Ht 5' 0.43 (1.535 m)   Wt 133 lb 3.2 oz (60.4 kg)   SpO2 99%   BMI 25.64 kg/m   Wt Readings from Last 3 Encounters:  10/24/23 133 lb 3.2 oz  (60.4 kg)   Physical Exam Constitutional:      General: She is not in acute distress.    Appearance: Normal appearance. She is not ill-appearing or toxic-appearing.  Cardiovascular:     Rate and Rhythm: Normal rate and regular rhythm.     Heart sounds: Normal heart sounds. No murmur heard.    No gallop.  Pulmonary:     Effort: Pulmonary effort is normal. No respiratory distress.     Breath sounds: Normal breath sounds. No wheezing or rales.  Abdominal:     General: Bowel sounds are normal. There is no distension.     Palpations: Abdomen is soft. There is no mass.     Tenderness: There is abdominal tenderness (LLQ). There is no right CVA tenderness, left CVA tenderness, guarding or rebound.     Hernia: No hernia is present.     Comments: Negative Murphy, Rovsing, Psoas signs. No pain at McBurney's point.  Musculoskeletal:     Cervical back: Tenderness (bilateral trapezius muscles) present. No rigidity or bony tenderness.     Thoracic back: Tenderness (midline and bilateral paraspinal) present. No spasms or bony tenderness. Normal range of motion.     Lumbar back: Tenderness (midline and bilateral paraspinal) present. No bony tenderness. Normal range of motion. Negative right straight leg raise test and negative left straight leg raise test.     Right hip:  Normal.     Left hip: Normal.     Comments: 5/5 muscle strength bilateral upper and lower extremities. 2+ patellar DTRs bilaterally.  Skin:    General: Skin is warm and dry.     Findings: No rash.  Neurological:     Mental Status: She is alert. Mental status is at baseline.     Sensory: No sensory deficit.     Motor: No weakness.     Coordination: Coordination normal.     Gait: Gait normal.     Deep Tendon Reflexes: Reflexes normal.  Psychiatric:        Mood and Affect: Mood normal.      Assessment/Plan   Orders Placed This Encounter  Procedures  . CBC And Differential  . Comprehensive Metabolic Panel  . ANA by IFA Rfx  Titer/Pattern  . Rheumatoid Factor  . C-Reactive Protein  . Sedimentation Rate, Automated  . POCT urinalysis dipstick  . POCT urine pregnancy test    1. Back pain, unspecified back location, unspecified back pain laterality, unspecified chronicity   2. Abdominal pain, unspecified abdominal location    Patient is well appearing without signs of distress. Vitals are stable. No signs of acute surgical abdomen. Symptoms are vague and non-specific. No identifiable injury or inciting event. UA negative for signs of infection. Upreg negative in office. Differential is broad and includes muscle strain, constipation, diverticulitis, pregnancy, UTI, fibromyalgia, ankylosing spondylitis, other autoimmune/inflammatory condition. Will proceed with basic labs including rheum work up to evaluate further. Counseled patient on continuing conservative management while awaiting results. Trial Naproxen for pain. Pending labs, may consider trial pf physical therapy and spine xrays. Patient agreeable and all questions answered to her satisfaction.   Follow up pending labs.  No future appointments.  Medications at end of visit today:  Current Outpatient Medications:  .  acetaminophen  (TYLENOL ) 325 mg tablet, Take one tablet (325 mg dose) by mouth every 6 (six) hours as needed., Disp: , Rfl:  .  naproxen (NAPROSYN) 500 mg tablet, Take one tablet (500 mg dose) by mouth 2 (two) times daily with meals., Disp: 60 tablet, Rfl: 0  Patient Care Team: Chianne Kline, PA-C as PCP - General (Family Medicine)

## 2024-04-28 ENCOUNTER — Telehealth: Payer: Self-pay | Admitting: Emergency Medicine

## 2024-04-28 NOTE — Telephone Encounter (Signed)
 Messaged pt about not having xray for urgent care appointment tomorrow.

## 2024-04-29 ENCOUNTER — Ambulatory Visit
Admission: RE | Admit: 2024-04-29 | Discharge: 2024-04-29 | Disposition: A | Discharge status: Home / Self Care | Attending: Internal Medicine | Admitting: Internal Medicine

## 2024-04-29 VITALS — BP 110/73 | HR 68 | Temp 97.9°F | Resp 16

## 2024-04-29 DIAGNOSIS — M5442 Lumbago with sciatica, left side: Secondary | ICD-10-CM | POA: Diagnosis not present

## 2024-04-29 MED ORDER — BACLOFEN 10 MG PO TABS: 10.0000 mg | ORAL_TABLET | Freq: Three times a day (TID) | ORAL | 0 refills | Status: AC

## 2024-04-29 MED ORDER — PREDNISONE 20 MG PO TABS: 40.0000 mg | ORAL_TABLET | Freq: Every day | ORAL | 0 refills | Status: AC

## 2024-04-29 NOTE — ED Triage Notes (Signed)
 Interpreter: Ragena [phone]   Pt c/o left knee pain at rest and with movement. Pt states pain radiates up to hip. Pain began last week.  Pt denies injury.

## 2024-04-29 NOTE — ED Provider Notes (Signed)
 [facility]    CSN: [id number] Arrival date & time: 04/29/24  9183      History   Chief Complaint Chief Complaint  Patient presents with   Knee Pain    Entered by patient    HPI Summer Diaz is a 28 y.o. female.   Summer Diaz is a 28 y.o. female presenting for chief complaint of acute left-sided low back pain that started approximately 1 week ago.  Pain began to radiate down the posterior left leg into the left knee a few days ago.  Low back pain and left knee pain were initially intermittent and worse with movement/weightbearing activity and have now become more constant in nature.  Pain is sharp and shooting from the low back to the left posterior knee. No fall, trauma, numbness or tingling, saddle paresthesia, changes to bowel or urinary habits, extremity weakness, and history of surgical procedures to the spine.  Denies chance of pregnancy, has Nexplanon  implant.  Denies recent use of steroids.  Denies recent heavy lifting.  She has never had sciatic nerve pain in the past but states her father has this and wonders if this could be causing her pain.  Using naproxen with minimal relief.  The history is provided by the patient. The history is limited by a language barrier. A language interpreter was used Air cabin crew interpreter used for entirety of patient encounter.).  Knee Pain   Past Medical History:  Diagnosis Date   Medical history non-contributory     Patient Active Problem List   Diagnosis Date Noted   Postpartum endometritis 06/01/2020   Urinary retention 06/01/2020   PROM (premature rupture of membranes) 05/30/2020   Nexplanon  insertion 05/30/2020   Vaginal delivery 07/24/2016    Past Surgical History:  Procedure Laterality Date   OVARIAN CYST SURGERY      OB History     Gravida  2   Para  2   Term  2   Preterm      AB      Living  2      SAB      IAB      Ectopic      Multiple  0   Live Births  2             Home Medications    Prior to Admission medications   Medication Sig Start Date End Date Taking? Authorizing Provider  baclofen  (LIORESAL ) 10 MG tablet Take 1 tablet (10 mg total) by mouth 3 (three) times daily. 04/29/24  Yes Enedelia Dorna HERO, FNP  predniSONE  (DELTASONE ) 20 MG tablet Take 2 tablets (40 mg total) by mouth daily with breakfast for 5 days. 04/29/24 05/04/24 Yes StanhopeDorna HERO, FNP  acetaminophen  (TYLENOL ) 325 MG tablet Take 325 mg by mouth every 6 (six) hours as needed for mild pain or headache.    [provider]  coconut oil OIL Apply 1 application topically as needed (nipple pain). 06/01/20   Myrl Therisa BRAVO, MD  ibuprofen  (ADVIL ) 600 MG tablet Take 1 tablet (600 mg total) by mouth every 8 (eight) hours. 06/01/20   Myrl Therisa BRAVO, MD  Prenatal Vit-Fe Fumarate-FA (PRENATAL MULTIVITAMIN) TABS tablet Take 1 tablet by mouth daily at 12 noon.    [provider]  senna-docusate (SENOKOT-S) 8.6-50 MG tablet Take 2 tablets by mouth daily. Patient not taking: Reported on 02/03/2020 07/26/16   Herold Arita BIRCH, MD    Family History History reviewed. No pertinent family history.  Social History Social History   Tobacco Use   Smoking status: Never   Smokeless tobacco: Never  Vaping Use   Vaping status: Never Used  Substance Use Topics   Alcohol use: No   Drug use: No     Allergies   Patient has no known allergies.   Review of Systems Review of Systems Per HPI  Physical Exam Triage Vital Signs ED Triage Vitals  Encounter Vitals Group     BP 04/29/24 0834 110/73     Girls Systolic BP Percentile --      Girls Diastolic BP Percentile --      Boys Systolic BP Percentile --      Boys Diastolic BP Percentile --      Pulse Rate 04/29/24 0834 68     Resp 04/29/24 0834 16     Temp 04/29/24 0834 97.9 F (36.6 C)     Temp src --      SpO2 04/29/24 0834 98 %     Weight --      Height --      Head Circumference --      Peak Flow --       Pain Score 04/29/24 0838 8     Pain Loc --      Pain Education --      Exclude from Growth Chart --    No data found.  Updated Vital Signs BP 110/73 (BP Location: Right Arm)   Pulse 68   Temp 97.9 F (36.6 C)   Resp 16   SpO2 98%   Visual Acuity Right Eye Distance:   Left Eye Distance:   Bilateral Distance:    Right Eye Near:   Left Eye Near:    Bilateral Near:     Physical Exam Vitals and nursing note reviewed.  Constitutional:      Appearance: She is not ill-appearing or toxic-appearing.  HENT:     Head: Normocephalic and atraumatic.     Right Ear: Hearing and external ear normal.     Left Ear: Hearing and external ear normal.     Nose: Nose normal.     Mouth/Throat:     Lips: Pink.  Eyes:     General: Lids are normal. Vision grossly intact. Gaze aligned appropriately.     Extraocular Movements: Extraocular movements intact.     Conjunctiva/sclera: Conjunctivae normal.  Pulmonary:     Effort: Pulmonary effort is normal.  Musculoskeletal:     Cervical back: Normal and neck supple.     Thoracic back: Normal.     Lumbar back: Tenderness present. No swelling, edema, deformity, signs of trauma, lacerations, spasms or bony tenderness. Normal range of motion. Negative right straight leg raise test and negative left straight leg raise test. No scoliosis.     Right knee: Normal.     Left knee: Normal.     Comments: Tender to palpation over the left SI joint space and left lumbar paraspinal muscles. +2 bilateral anterior tibialis pulses.  Skin:    General: Skin is warm and dry.     Capillary Refill: Capillary refill takes less than 2 seconds.     Findings: No rash.  Neurological:     General: No focal deficit present.     Mental Status: She is alert and oriented to person, place, and time. Mental status is at baseline.     GCS: GCS eye subscore is 4. GCS verbal subscore is 5. GCS motor subscore is 6.  Cranial Nerves: Cranial nerves 2-12 are intact. No dysarthria or  facial asymmetry.     Sensory: Sensation is intact.     Motor: Motor function is intact. No weakness, tremor, abnormal muscle tone or pronator drift.     Coordination: Coordination is intact. Romberg sign negative. Coordination normal. Finger-Nose-Finger Test normal.     Gait: Gait is intact.     Comments: Strength and sensation intact to bilateral upper and lower extremities (5/5). Moves all 4 extremities with normal coordination voluntarily. Non-focal neuro exam.   Psychiatric:        Mood and Affect: Mood normal.        Speech: Speech normal.        Behavior: Behavior normal.        Thought Content: Thought content normal.        Judgment: Judgment normal.      UC Treatments / Results  Labs (all labs ordered are listed, but only abnormal results are displayed) Labs Reviewed - No data to display  EKG   Radiology No results found.  Procedures Procedures (including critical care time)  Medications Ordered in UC Medications - No data to display  Initial Impression / Assessment and Plan / UC Course  I have reviewed the triage vital signs and the nursing notes.  Pertinent labs & imaging results that were available during my care of the patient were reviewed by me and considered in my medical decision making (see chart for details).   1.  Acute left-sided low back pain with left-sided sciatica Evaluation suggests sciatic nerve pain.  No red flag signs/symptoms found on exam indicating need for referral to ED for further workup.  Deferred imaging based on atraumatic mechanism of injury.  Pain has not responded well to NSAIDs, therefore will treat with short course of steroid to be started today..  Muscle relaxer as needed for muscular involvement, drowsiness precautions discussed. Follow-up with orthopedics as needed, walking referral given.   Counseled patient on potential for adverse effects with medications prescribed/recommended today, strict ER and return-to-clinic  precautions discussed, patient verbalized understanding.    Final Clinical Impressions(s) / UC Diagnoses   Final diagnoses:  Acute left-sided low back pain with left-sided sciatica     Discharge Instructions      Nuestro dolor lumbar se debe a un dolor del nervio citico.  Comience a tomar 2 pastillas de prednisona (40 mg) una vez al da con el desayuno durante los prximos 5 [place]. Tmelas con alimentos para evitar malestar estomacal. No tome naproxeno ni ibuprofeno mientras est tomando prednisona.  Tome un relajante muscular antes de acostarse, segn sea necesario, para el espasmo muscular. Tenga en cuenta que el relajante muscular puede causar somnolencia, as que no lo tome durante el da ni si est bebiendo o conduciendo.  Aplique calor en la zona lumbar y realice ejercicios suaves de rango de movimiento para prevenir la rigidez en la zona.  Programe una cita de seguimiento con su mdico de cabecera o el ortopedista que figura en su documentacin.   Your low back pain is due to sciatic nerve pain.  Start taking prednisone  2 pills (40mg ) once daily with breakfast for the next 5 days.  Take this with food to avoid stomach upset. No naproxen or ibuprofen  when taking prednisone .   Take muscle relaxer at bedtime as needed for muscle spasm.  No that the muscle relaxer may make you sleepy, so do not take this during the daytime or when drinking/driving.  Apply  heat to the low back and use gentle range of motion exercises to prevent stiffness to the area.  Please schedule an appointment for follow-up with your primary care provider or the orthopedic provider listed on your paperwork.    ED Prescriptions     Medication Sig Dispense Auth. Provider   predniSONE  (DELTASONE ) 20 MG tablet Take 2 tablets (40 mg total) by mouth daily with breakfast for 5 days. 10 tablet Enedelia Dorna HERO, FNP   baclofen  (LIORESAL ) 10 MG tablet Take 1 tablet (10 mg total) by mouth 3 (three) times  daily. 30 each Enedelia Dorna HERO, FNP      PDMP not reviewed this encounter.   Enedelia Dorna [place], [place] 04/29/24 [phone]

## 2024-04-29 NOTE — Discharge Instructions (Signed)
 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  Comience a tomar 2 pastillas de prednisona (40 mg) una vez al da con el desayuno durante los prximos 5 [place]. Tmelas con alimentos para evitar malestar estomacal. No tome naproxeno ni ibuprofeno mientras est tomando prednisona.  Tome un relajante muscular antes de acostarse, segn sea necesario, para el espasmo muscular. Tenga en cuenta que el relajante muscular puede causar somnolencia, as que no lo tome durante el da ni si est bebiendo o conduciendo.  Aplique calor en la zona lumbar y realice ejercicios suaves de rango de movimiento para prevenir la rigidez en la zona.  Programe una cita de seguimiento con su mdico de cabecera o el ortopedista que figura en su documentacin.   Your low back pain is due to sciatic nerve pain.  Start taking prednisone  2 pills (40mg ) once daily with breakfast for the next 5 days.  Take this with food to avoid stomach upset. No naproxen or ibuprofen  when taking prednisone .   Take muscle relaxer at bedtime as needed for muscle spasm.  No that the muscle relaxer may make you sleepy, so do not take this during the daytime or when drinking/driving.  Apply heat to the low back and use gentle range of motion exercises to prevent stiffness to the area.  Please schedule an appointment for follow-up with your primary care provider or the orthopedic provider listed on your paperwork.
